# Patient Record
Sex: Male | Born: 1950
Health system: Southern US, Community
[De-identification: ages and names within clinical notes are randomized; demographics above are authoritative.]

## PROBLEM LIST (undated history)

## (undated) DIAGNOSIS — L309 Dermatitis, unspecified: Secondary | ICD-10-CM

## (undated) DIAGNOSIS — J45909 Unspecified asthma, uncomplicated: Secondary | ICD-10-CM

## (undated) DIAGNOSIS — I779 Disorder of arteries and arterioles, unspecified: Secondary | ICD-10-CM

## (undated) DIAGNOSIS — I739 Peripheral vascular disease, unspecified: Secondary | ICD-10-CM

## (undated) DIAGNOSIS — N529 Male erectile dysfunction, unspecified: Secondary | ICD-10-CM

## (undated) DIAGNOSIS — E785 Hyperlipidemia, unspecified: Secondary | ICD-10-CM

## (undated) DIAGNOSIS — Z889 Allergy status to unspecified drugs, medicaments and biological substances status: Secondary | ICD-10-CM

## (undated) DIAGNOSIS — I10 Essential (primary) hypertension: Secondary | ICD-10-CM

## (undated) DIAGNOSIS — H269 Unspecified cataract: Secondary | ICD-10-CM

## (undated) HISTORY — DX: Unspecified cataract: H26.9

## (undated) HISTORY — DX: Allergy status to unspecified drugs, medicaments and biological substances: Z88.9

## (undated) HISTORY — PX: COLONOSCOPY: SHX174

## (undated) HISTORY — PX: VASECTOMY: SHX75

## (undated) HISTORY — DX: Male erectile dysfunction, unspecified: N52.9

## (undated) HISTORY — DX: Hyperlipidemia, unspecified: E78.5

## (undated) HISTORY — DX: Peripheral vascular disease, unspecified: I73.9

## (undated) HISTORY — DX: Disorder of arteries and arterioles, unspecified: I77.9

## (undated) HISTORY — DX: Essential (primary) hypertension: I10

## (undated) HISTORY — DX: Dermatitis, unspecified: L30.9

## (undated) HISTORY — DX: Unspecified asthma, uncomplicated: J45.909

---

## 2004-05-11 ENCOUNTER — Ambulatory Visit: Payer: Self-pay | Admitting: Family Medicine

## 2005-01-01 ENCOUNTER — Ambulatory Visit: Payer: Self-pay | Admitting: Family Medicine

## 2005-07-09 ENCOUNTER — Ambulatory Visit: Payer: Self-pay | Admitting: Family Medicine

## 2005-07-11 ENCOUNTER — Ambulatory Visit: Payer: Self-pay | Admitting: Family Medicine

## 2005-07-16 ENCOUNTER — Ambulatory Visit: Payer: Self-pay | Admitting: Family Medicine

## 2006-04-22 ENCOUNTER — Ambulatory Visit: Payer: Self-pay | Admitting: Family Medicine

## 2006-06-23 ENCOUNTER — Ambulatory Visit: Payer: Self-pay | Admitting: Family Medicine

## 2006-09-18 ENCOUNTER — Ambulatory Visit: Payer: Self-pay | Admitting: Family Medicine

## 2011-10-16 ENCOUNTER — Encounter: Payer: Self-pay | Admitting: Cardiology

## 2011-10-29 ENCOUNTER — Encounter: Payer: Self-pay | Admitting: Cardiology

## 2011-10-29 ENCOUNTER — Encounter: Payer: Self-pay | Admitting: *Deleted

## 2011-10-29 ENCOUNTER — Ambulatory Visit (INDEPENDENT_AMBULATORY_CARE_PROVIDER_SITE_OTHER): Payer: Self-pay | Admitting: Cardiology

## 2011-10-29 VITALS — BP 126/85 | HR 79 | Ht 70.0 in | Wt 231.4 lb

## 2011-10-29 DIAGNOSIS — Z136 Encounter for screening for cardiovascular disorders: Secondary | ICD-10-CM

## 2011-10-29 DIAGNOSIS — I6529 Occlusion and stenosis of unspecified carotid artery: Secondary | ICD-10-CM

## 2011-10-29 DIAGNOSIS — E785 Hyperlipidemia, unspecified: Secondary | ICD-10-CM | POA: Insufficient documentation

## 2011-10-29 DIAGNOSIS — I10 Essential (primary) hypertension: Secondary | ICD-10-CM

## 2011-10-29 NOTE — Progress Notes (Signed)
HPI The patient presents for evaluation of known carotid plaque. He had a health screening 4 years ago and said that he had some moderate to mild plaque but he has not had followup of this. He's never had any other cardiac history. He doesn't report any stress testing or other cardiac testing. He does have cardiovascular risk factors however. 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 With mild to moderate activity he does not get chest pressure, neck or arm discomfort. He does not have palpitations, presyncope or syncope. He may get some shortness of breath with moderate activity but he doesn't describe any PND or orthopnea. He says his wife tells and he "breathes hard" at night. He's had no weight gain or edema.  No Known Allergies  Current Outpatient Prescriptions  Medication Sig Dispense Refill  . amLODipine (NORVASC) 5 MG tablet Take 5 mg by mouth daily.      . Montelukast Sodium (SINGULAIR PO) Take by mouth as needed.      . sildenafil (VIAGRA) 100 MG tablet Take 50 mg by mouth daily as needed.       . zolpidem (AMBIEN) 10 MG tablet Take 10 mg by mouth at bedtime as needed.        Past Medical History  Diagnosis Date  . Erectile dysfunction   . Multiple allergies   . Hypertension   . Hyperlipidemia     Past Surgical History  Procedure Date  . Colonoscopy     2009  . Vasectomy     Family History  Problem Relation Age of Onset  . Hypertension Father   . Stroke Father 1  . Heart disease Father     "Enlarged heart"    History   Social History  . Marital Status: Married    Spouse Name: N/A    Number of Children: 0  . Years of Education: N/A   Occupational History  . Machinist/farmer    Social History Main Topics  . Smoking status: Former Smoker -- 3.0 packs/day for 10 years    Types: Cigarettes    Quit date: 03/25/1978  . Smokeless tobacco: Not on file   Comment: Only smoked socially   . Alcohol Use: Yes     occ.  . Drug  Use: No  . Sexually Active: Not on file   Other Topics Concern  . Not on file   Social History Narrative  . No narrative on file    ROS:  Positive for eczema, sinus problems, seasonal allergies, occasional dizziness, reflux, poor sleep habits. Otherwise as stated in the HPI and negative for all other systems.   PHYSICAL EXAM BP 126/85  Pulse 79  Ht 5\' 10"  (1.778 m)  Wt 231 lb 6.4 oz (104.962 kg)  BMI 33.20 kg/m2 GENERAL:  Well appearing HEENT:  Pupils equal round and reactive, fundi not visualized, oral mucosa unremarkable NECK:  No jugular venous distention, waveform within normal limits, carotid upstroke brisk and symmetric, no bruits, no thyromegaly LYMPHATICS:  No cervical, inguinal adenopathy LUNGS:  Clear to auscultation bilaterally BACK:  No CVA tenderness CHEST:  Unremarkable HEART:  PMI not displaced or sustained,S1 and S2 within normal limits, no S3, no S4, no clicks, no rubs, no murmurs ABD:  Flat, positive bowel sounds normal in frequency in pitch, no bruits, no rebound, no guarding, no midline pulsatile mass, no hepatomegaly, no splenomegaly EXT:  2 plus pulses throughout, no edema, no cyanosis no clubbing SKIN:  No  rashes no nodules NEURO:  Cranial nerves II through XII grossly intact, motor grossly intact throughout PSYCH:  Cognitively intact, oriented to person place and time  EKG:  Sinus arrhythmia, rate 79, low voltage in limb leads, poor anterior R wave progression, no acute ST-T wave changes. 10/29/2011  ASSESSMENT AND PLAN  Carotid stenosis - I will follow with carotid Dopplers to reassess this.  Dyspnea -  He has some mild dyspnea and with this and evidence of some vascular disease as above and risk factors will have screening stress testing. I will bring the patient back for a POET (Plain Old Exercise Test). This will allow me to screen for obstructive coronary disease, risk stratify and very importantly provide a prescription for  exercise.  Overweight- The patient understands the need to lose weight with diet and exercise. We have discussed specific strategies for this.  Hypertension - His blood pressure today is well controlled and he will continue the meds as listed.   Dyslipidemia- He thinks his LDL is less than 100 which would be the target. I will defer to Josue Hector, MD

## 2011-10-29 NOTE — Patient Instructions (Addendum)
   Exercise Treadmill Test [facility] office)  Carotid Dopplers   Office will contact with results Follow up as needed

## 2011-11-01 ENCOUNTER — Encounter (INDEPENDENT_AMBULATORY_CARE_PROVIDER_SITE_OTHER): Payer: BC Managed Care – PPO

## 2011-11-01 DIAGNOSIS — I6529 Occlusion and stenosis of unspecified carotid artery: Secondary | ICD-10-CM

## 2011-11-07 ENCOUNTER — Ambulatory Visit (INDEPENDENT_AMBULATORY_CARE_PROVIDER_SITE_OTHER): Payer: BC Managed Care – PPO | Admitting: Nurse Practitioner

## 2011-11-07 ENCOUNTER — Encounter: Payer: Self-pay | Admitting: Nurse Practitioner

## 2011-11-07 DIAGNOSIS — I6529 Occlusion and stenosis of unspecified carotid artery: Secondary | ICD-10-CM

## 2011-11-07 NOTE — Procedures (Signed)
Exercise Treadmill Test  Pre-Exercise Testing Evaluation Rhythm: normal sinus  Rate: 74   PR:  .16 QRS:  .08  QT:  .36 QTc: .40     Test  Exercise Tolerance Test Ordering MD: Angelina Sheriff, MD  Interpreting MD: Ward Givens , NP  Unique Test No: 1  Treadmill:  1  Indication for ETT: HTN  Contraindication to ETT: No   Stress Modality: exercise - treadmill  Cardiac Imaging Performed: non   Protocol: standard Bruce - maximal  Max BP:  220/63  Max MPHR (bpm):  160 85% MPR (bpm):  136  MPHR obtained (bpm):  171 % MPHR obtained:  105%  Reached 85% MPHR (min:sec):  3:30 Total Exercise Time (min-sec): 9:00  Workload in METS:  10.1 Borg Scale: 14  Reason ETT Terminated:  fatigue    ST Segment Analysis At Rest: normal ST segments - no evidence of significant ST depression With Exercise: no evidence of significant ST depression  Other Information Arrhythmia:  isolated pvc's - asymptomatic Angina during ETT:  absent (0) Quality of ETT:  diagnostic  ETT Interpretation:  normal - no evidence of ischemia by ST analysis  Comments: Good exercise tolerance.  No chest pain.  No acute st/t changes.  Blood pressure rose to peak of 220/63 during exercise and returned to 119/73 by 6 mins in recovery. HR reduced from 171 to 157 within 1 minute of end of exercise.  Recommendations: Follow-up with Dr. Antoine Poche in 1 yr as planned.

## 2012-11-20 ENCOUNTER — Encounter (INDEPENDENT_AMBULATORY_CARE_PROVIDER_SITE_OTHER): Payer: BC Managed Care – PPO

## 2012-11-20 DIAGNOSIS — I6529 Occlusion and stenosis of unspecified carotid artery: Secondary | ICD-10-CM

## 2013-12-08 ENCOUNTER — Other Ambulatory Visit (HOSPITAL_COMMUNITY): Payer: Self-pay | Admitting: Radiology

## 2013-12-08 DIAGNOSIS — I6529 Occlusion and stenosis of unspecified carotid artery: Secondary | ICD-10-CM

## 2013-12-16 ENCOUNTER — Encounter (INDEPENDENT_AMBULATORY_CARE_PROVIDER_SITE_OTHER): Payer: BC Managed Care – PPO

## 2013-12-16 DIAGNOSIS — I6529 Occlusion and stenosis of unspecified carotid artery: Secondary | ICD-10-CM

## 2014-02-07 ENCOUNTER — Telehealth: Payer: Self-pay | Admitting: Family Medicine

## 2014-02-07 NOTE — Telephone Encounter (Signed)
Patient currently has bcbs but he is retiring in January- He currently is taking amlodipine, atorvastatin, viagra, singulair. Appointment given for January with Jannifer Rodneyhristy Hawks, FNP

## 2014-04-18 ENCOUNTER — Ambulatory Visit: Payer: Self-pay | Admitting: Family

## 2014-04-20 ENCOUNTER — Ambulatory Visit (INDEPENDENT_AMBULATORY_CARE_PROVIDER_SITE_OTHER): Payer: BLUE CROSS/BLUE SHIELD | Admitting: Family

## 2014-04-20 ENCOUNTER — Encounter: Payer: Self-pay | Admitting: Family

## 2014-04-20 ENCOUNTER — Encounter (INDEPENDENT_AMBULATORY_CARE_PROVIDER_SITE_OTHER): Payer: Self-pay

## 2014-04-20 VITALS — BP 147/93 | HR 79 | Temp 97.1°F | Ht 70.0 in | Wt 231.8 lb

## 2014-04-20 DIAGNOSIS — E785 Hyperlipidemia, unspecified: Secondary | ICD-10-CM

## 2014-04-20 DIAGNOSIS — Z1321 Encounter for screening for nutritional disorder: Secondary | ICD-10-CM

## 2014-04-20 DIAGNOSIS — I10 Essential (primary) hypertension: Secondary | ICD-10-CM

## 2014-04-20 DIAGNOSIS — N529 Male erectile dysfunction, unspecified: Secondary | ICD-10-CM

## 2014-04-20 DIAGNOSIS — Z125 Encounter for screening for malignant neoplasm of prostate: Secondary | ICD-10-CM

## 2014-04-20 MED ORDER — SILDENAFIL CITRATE 100 MG PO TABS: 50.0000 mg | ORAL_TABLET | Freq: Every day | ORAL | Status: DC | PRN

## 2014-04-20 MED ORDER — AMLODIPINE BESYLATE 10 MG PO TABS: 10.0000 mg | ORAL_TABLET | Freq: Every day | ORAL | Status: DC

## 2014-04-20 MED ORDER — ATORVASTATIN CALCIUM 10 MG PO TABS: 10.0000 mg | ORAL_TABLET | Freq: Every day | ORAL | Status: DC

## 2014-04-20 NOTE — Patient Instructions (Signed)

## 2014-04-20 NOTE — Progress Notes (Signed)
Subjective:    Patient ID: John Mejia, male    DOB: [birth year], 64 y.o.   MRN: [id number]  Hypertension This is a chronic problem. The current episode started more than 1 year ago. The problem has been waxing and waning since onset. The problem is uncontrolled. Pertinent negatives include no anxiety, blurred vision, headaches, palpitations, peripheral edema or shortness of breath. Risk factors for coronary artery disease include dyslipidemia, male gender and family history. Past treatments include calcium channel blockers. The current treatment provides moderate improvement. Hypertensive end-organ damage includes CAD/MI. There is no history of kidney disease, CVA, heart failure or a thyroid problem. There is no history of sleep apnea.  Hyperlipidemia This is a chronic problem. The current episode started more than 1 year ago. The problem is controlled. Recent lipid tests were reviewed and are normal. He has no history of diabetes or hypothyroidism. Pertinent negatives include no leg pain, myalgias or shortness of breath. Current antihyperlipidemic treatment includes statins. The current treatment provides moderate improvement of lipids. Risk factors for coronary artery disease include dyslipidemia, family history, hypertension and male sex.      Review of Systems  Constitutional: Negative.   HENT: Negative.   Eyes: Negative for blurred vision.  Respiratory: Negative.  Negative for shortness of breath.   Cardiovascular: Negative.  Negative for palpitations.  Gastrointestinal: Negative.   Endocrine: Negative.   Genitourinary: Negative.   Musculoskeletal: Negative.  Negative for myalgias.  Skin: Negative.   Neurological: Negative.  Negative for headaches.  Hematological: Negative.   Psychiatric/Behavioral: Negative.   All other systems reviewed and are negative.      Objective:   Physical Exam  Constitutional: He is oriented to person, place, and time. He appears well-developed and  well-nourished. No distress.  HENT:  Head: Normocephalic.  Right Ear: External ear normal.  Left Ear: External ear normal.  Nose: Nose normal.  Mouth/Throat: Oropharynx is clear and moist.  Eyes: Pupils are equal, round, and reactive to light. Right eye exhibits no discharge. Left eye exhibits no discharge.  Neck: Normal range of motion. Neck supple. No thyromegaly present.  Cardiovascular: Normal rate, regular rhythm, normal heart sounds and intact distal pulses.   No murmur heard. Pulmonary/Chest: Effort normal and breath sounds normal. No respiratory distress. He has no wheezes.  Abdominal: Soft. Bowel sounds are normal. He exhibits no distension. There is no tenderness.  Musculoskeletal: Normal range of motion. He exhibits no edema or tenderness.  Neurological: He is alert and oriented to person, place, and time. He has normal reflexes. No cranial nerve deficit.  Skin: Skin is warm and dry. No rash noted. No erythema.  Psychiatric: He has a normal mood and affect. His behavior is normal. Judgment and thought content normal.  Vitals reviewed.   BP 158/97 mmHg  Pulse 81  Temp(Src) 97.1 F (36.2 C) (Oral)  Ht '5\' 10"'  (1.778 m)  Wt 231 lb 12.8 oz (105.144 kg)  BMI 33.26 kg/m2       Assessment & Plan:  1. Essential hypertension --Amlodipine increased from 89m to 144mdaily Daily blood pressure log given with instructions on how to fill out and told to bring to next visit -Dash diet information given -Exercise encouraged - Stress Management  -Continue current meds -RTO in 2 weeks  CMP14+EGFR - amLODipine (NORVASC) 10 MG tablet; Take 1 tablet (10 mg total) by mouth daily.  Dispense: 90 tablet; Refill: 3  2. Hyperlipidemia - CMP14+EGFR - Lipid panel - atorvastatin (LIPITOR) 10 MG  tablet; Take 1 tablet (10 mg total) by mouth daily.  Dispense: 90 tablet; Refill: 3  3. Prostate cancer screening - PSA, total and free  4. Encounter for vitamin deficiency screening - Vit D   25 hydroxy (rtn osteoporosis monitoring  5. Erectile dysfunction, unspecified erectile dysfunction type - sildenafil (VIAGRA) 100 MG tablet; Take 0.5 tablets (50 mg total) by mouth daily as needed.  Dispense: 10 tablet; Refill: 2  Continue all meds Labs pending Health Maintenance reviewed Diet and exercise encouraged RTO 2 weeks   Evelina Dun, FNP

## 2014-04-21 LAB — CMP14+EGFR
ALT: 31 [IU]/L (ref 0–44)
AST: 36 [IU]/L (ref 0–40)
Albumin/Globulin Ratio: 2 (ref 1.1–2.5)
Albumin: 4.7 g/dL (ref 3.6–4.8)
Alkaline Phosphatase: 80 [IU]/L (ref 39–117)
BUN/Creatinine Ratio: 13 (ref 10–22)
BUN: 13 mg/dL (ref 8–27)
CO2: 25 mmol/L (ref 18–29)
Calcium: 11.2 mg/dL — ABNORMAL HIGH (ref 8.6–10.2)
Chloride: 104 mmol/L (ref 97–108)
Creatinine, Ser: 1.02 mg/dL (ref 0.76–1.27)
GFR calc Af Amer: 90 mL/min/{1.73_m2}
GFR calc non Af Amer: 78 mL/min/{1.73_m2}
Globulin, Total: 2.4 g/dL (ref 1.5–4.5)
Glucose: 100 mg/dL — ABNORMAL HIGH (ref 65–99)
Potassium: 4.7 mmol/L (ref 3.5–5.2)
Sodium: 142 mmol/L (ref 134–144)
Total Bilirubin: 0.6 mg/dL (ref 0.0–1.2)
Total Protein: 7.1 g/dL (ref 6.0–8.5)

## 2014-04-21 LAB — LIPID PANEL
Chol/HDL Ratio: 2.3 ratio (ref 0.0–5.0)
Cholesterol, Total: 135 mg/dL (ref 100–199)
HDL: 59 mg/dL
LDL Calculated: 64 mg/dL (ref 0–99)
Triglycerides: 60 mg/dL (ref 0–149)
VLDL Cholesterol Cal: 12 mg/dL (ref 5–40)

## 2014-04-21 LAB — PSA, TOTAL AND FREE
PSA, Free Pct: 28.6 %
PSA, Free: 0.2 ng/mL
PSA: 0.7 ng/mL (ref 0.0–4.0)

## 2014-04-21 LAB — VITAMIN D 25 HYDROXY (VIT D DEFICIENCY, FRACTURES): VIT D 25 HYDROXY: 18 ng/mL — AB (ref 30.0–100.0)

## 2014-04-22 ENCOUNTER — Other Ambulatory Visit: Payer: Self-pay | Admitting: Family

## 2014-04-22 MED ORDER — VITAMIN D (ERGOCALCIFEROL) 1.25 MG (50000 UNIT) PO CAPS: 50000.0000 [IU] | ORAL_CAPSULE | ORAL | Status: DC

## 2014-05-02 ENCOUNTER — Telehealth: Payer: Self-pay | Admitting: *Deleted

## 2014-05-02 NOTE — Telephone Encounter (Signed)
John Mejia, Ins co denied his viagra saying he must fail formulary medication which is cialis.  Will this work for him and if so will you take care of? Thanks

## 2014-05-06 ENCOUNTER — Encounter: Payer: Self-pay | Admitting: Family

## 2014-05-06 ENCOUNTER — Ambulatory Visit (INDEPENDENT_AMBULATORY_CARE_PROVIDER_SITE_OTHER): Payer: BLUE CROSS/BLUE SHIELD | Admitting: Family

## 2014-05-06 VITALS — BP 136/85 | HR 65 | Temp 97.5°F | Ht 70.0 in | Wt 229.4 lb

## 2014-05-06 DIAGNOSIS — N529 Male erectile dysfunction, unspecified: Secondary | ICD-10-CM

## 2014-05-06 DIAGNOSIS — I10 Essential (primary) hypertension: Secondary | ICD-10-CM

## 2014-05-06 MED ORDER — TADALAFIL 20 MG PO TABS: 20.0000 mg | ORAL_TABLET | Freq: Every day | ORAL | Status: DC | PRN

## 2014-05-06 MED ORDER — SILDENAFIL CITRATE 20 MG PO TABS: ORAL_TABLET | ORAL | Status: DC

## 2014-05-06 NOTE — Patient Instructions (Signed)

## 2014-05-06 NOTE — Progress Notes (Signed)
   Subjective:    Patient ID: John Mejia, male    DOB: 06/01/1950, 64 y.o.   MRN: [id number]  Pt presents to the office for follow-up on hypertension. Pt's BP today is at goal!! Hypertension This is a chronic problem. The current episode started more than 1 year ago. The problem has been resolved since onset. The problem is controlled. Pertinent negatives include no anxiety, headaches, malaise/fatigue, palpitations, peripheral edema or shortness of breath. Risk factors for coronary artery disease include dyslipidemia and male gender. Past treatments include calcium channel blockers. The current treatment provides moderate improvement. There is no history of kidney disease, CAD/MI, CVA, heart failure or a thyroid problem. There is no history of sleep apnea.      Review of Systems  Constitutional: Negative.  Negative for malaise/fatigue.  HENT: Negative.   Respiratory: Negative.  Negative for shortness of breath.   Cardiovascular: Negative.  Negative for palpitations.  Gastrointestinal: Negative.   Endocrine: Negative.   Genitourinary: Negative.   Musculoskeletal: Negative.   Neurological: Negative.  Negative for headaches.  Hematological: Negative.   Psychiatric/Behavioral: Negative.   All other systems reviewed and are negative.      Objective:   Physical Exam  Constitutional: He is oriented to person, place, and time. He appears well-developed and well-nourished. No distress.  HENT:  Head: Normocephalic.  Right Ear: External ear normal.  Left Ear: External ear normal.  Nose: Nose normal.  Mouth/Throat: Oropharynx is clear and moist.  Eyes: Pupils are equal, round, and reactive to light. Right eye exhibits no discharge. Left eye exhibits no discharge.  Neck: Normal range of motion. Neck supple. No thyromegaly present.  Cardiovascular: Normal rate, regular rhythm, normal heart sounds and intact distal pulses.   No murmur heard. Pulmonary/Chest: Effort normal and breath sounds  normal. No respiratory distress. He has no wheezes.  Abdominal: Soft. Bowel sounds are normal. He exhibits no distension. There is no tenderness.  Musculoskeletal: Normal range of motion. He exhibits no edema or tenderness.  Neurological: He is alert and oriented to person, place, and time. He has normal reflexes. No cranial nerve deficit.  Skin: Skin is warm and dry. No rash noted. No erythema.  Psychiatric: He has a normal mood and affect. His behavior is normal. Judgment and thought content normal.  Vitals reviewed.    BP 136/85 mmHg  Pulse 65  Temp(Src) 97.5 F (36.4 C) (Oral)  Ht 5\' 10"  (1.778 m)  Wt 229 lb 6.4 oz (104.055 kg)  BMI 32.92 kg/m2      Assessment & Plan:  1. Essential hypertension -Daily blood pressure log given with instructions on how to fill out and told to bring to next visit -Dash diet information given -Exercise encouraged - Stress Management  -Continue current meds -RTO in 6 months  2. Erectile dysfunction, unspecified erectile dysfunction type -Pt was given printed [facility] for [facility] price if cialis rx is too expensive - sildenafil (REVATIO) 20 MG tablet; Take 2-5 tables as needed for sexual activity  Dispense: 50 tablet; Refill: 3 - tadalafil (CIALIS) 20 MG tablet; Take 1 tablet (20 mg total) by mouth daily as needed for erectile dysfunction.  Dispense: 10 tablet; Refill: 0   Jannifer Rodneyhristy Tijuan Dantes, FNP

## 2014-05-17 NOTE — Telephone Encounter (Signed)
Medication changed by Christy.  

## 2014-11-07 ENCOUNTER — Ambulatory Visit (INDEPENDENT_AMBULATORY_CARE_PROVIDER_SITE_OTHER): Payer: BLUE CROSS/BLUE SHIELD | Admitting: Family

## 2014-11-07 ENCOUNTER — Encounter: Payer: Self-pay | Admitting: Family

## 2014-11-07 VITALS — BP 138/85 | HR 78 | Temp 97.4°F | Ht 69.0 in | Wt 224.0 lb

## 2014-11-07 DIAGNOSIS — N529 Male erectile dysfunction, unspecified: Secondary | ICD-10-CM | POA: Diagnosis not present

## 2014-11-07 DIAGNOSIS — I10 Essential (primary) hypertension: Secondary | ICD-10-CM

## 2014-11-07 DIAGNOSIS — Z0000 Encounter for general adult medical examination without abnormal findings: Secondary | ICD-10-CM | POA: Diagnosis not present

## 2014-11-07 DIAGNOSIS — Z23 Encounter for immunization: Secondary | ICD-10-CM

## 2014-11-07 DIAGNOSIS — E785 Hyperlipidemia, unspecified: Secondary | ICD-10-CM | POA: Diagnosis not present

## 2014-11-07 MED ORDER — AMLODIPINE BESYLATE 10 MG PO TABS: 10.0000 mg | ORAL_TABLET | Freq: Every day | ORAL | Status: DC

## 2014-11-07 MED ORDER — ATORVASTATIN CALCIUM 10 MG PO TABS: 10.0000 mg | ORAL_TABLET | Freq: Every day | ORAL | Status: DC

## 2014-11-07 MED ORDER — SILDENAFIL CITRATE 100 MG PO TABS: 50.0000 mg | ORAL_TABLET | Freq: Every day | ORAL | Status: DC | PRN

## 2014-11-07 NOTE — Patient Instructions (Signed)

## 2014-11-07 NOTE — Addendum Note (Signed)
Addended by: Prescott Gum on: 11/07/2014 03:30 PM   Modules accepted: Kipp Brood

## 2014-11-07 NOTE — Progress Notes (Signed)
Subjective:    Patient ID: John Mejia, male    DOB: [birth year], 64 y.o.   MRN: [id number]  Pt presents to the office today for CPE.  Hypertension This is a chronic problem. The current episode started more than 1 year ago. The problem has been waxing and waning since onset. The problem is uncontrolled. Pertinent negatives include no anxiety, blurred vision, headaches, palpitations, peripheral edema or shortness of breath. Risk factors for coronary artery disease include dyslipidemia, male gender and family history. Past treatments include calcium channel blockers. The current treatment provides moderate improvement. Hypertensive end-organ damage includes CAD/MI. There is no history of kidney disease, CVA, heart failure or a thyroid problem. There is no history of sleep apnea.  Hyperlipidemia This is a chronic problem. The current episode started more than 1 year ago. The problem is controlled. Recent lipid tests were reviewed and are normal. He has no history of diabetes or hypothyroidism. Pertinent negatives include no leg pain, myalgias or shortness of breath. Current antihyperlipidemic treatment includes statins. The current treatment provides moderate improvement of lipids. Risk factors for coronary artery disease include dyslipidemia, family history, hypertension and male sex.  ED Pt states his insurance will not pay for Cialis. Pt would like to try Viagra rx today.     Review of Systems  Constitutional: Negative.   HENT: Negative.   Eyes: Negative for blurred vision.  Respiratory: Negative.  Negative for shortness of breath.   Cardiovascular: Negative.  Negative for palpitations.  Gastrointestinal: Negative.   Endocrine: Negative.   Genitourinary: Negative.   Musculoskeletal: Negative.  Negative for myalgias.  Neurological: Negative.  Negative for headaches.  Hematological: Negative.   Psychiatric/Behavioral: Negative.   All other systems reviewed and are negative.        Objective:   Physical Exam  Constitutional: He is oriented to person, place, and time. He appears well-developed and well-nourished. No distress.  HENT:  Head: Normocephalic.  Right Ear: External ear normal.  Left Ear: External ear normal.  Nose: Nose normal.  Mouth/Throat: Oropharynx is clear and moist.  Eyes: Pupils are equal, round, and reactive to light. Right eye exhibits no discharge. Left eye exhibits no discharge.  Neck: Normal range of motion. Neck supple. No thyromegaly present.  Cardiovascular: Normal rate, regular rhythm, normal heart sounds and intact distal pulses.   No murmur heard. Pulmonary/Chest: Effort normal and breath sounds normal. No respiratory distress. He has no wheezes.  Abdominal: Soft. Bowel sounds are normal. He exhibits no distension. There is no tenderness.  Musculoskeletal: Normal range of motion. He exhibits no edema or tenderness.  Neurological: He is alert and oriented to person, place, and time. He has normal reflexes. No cranial nerve deficit.  Skin: Skin is warm and dry. No rash noted. No erythema.  Psychiatric: He has a normal mood and affect. His behavior is normal. Judgment and thought content normal.  Vitals reviewed.  EKG-Sinus Rhythm    BP 151/92 mmHg  Pulse 78  Temp(Src) 97.4 F (36.3 C) (Oral)  Ht _0  (1.753 m)  Wt 224 lb (101.606 kg)  BMI 33.06 kg/m2     Assessment & Plan:  1. Essential hypertension - CMP14+EGFR - amLODipine (NORVASC) 10 MG tablet; Take 1 tablet (10 mg total) by mouth daily.  Dispense: 90 tablet; Refill: 3 - EKG 12-Lead  2. Hyperlipidemia - CMP14+EGFR - Lipid panel - atorvastatin (LIPITOR) 10 MG tablet; Take 1 tablet (10 mg total) by mouth daily.  Dispense: 90 tablet; Refill: 3  3. Erectile dysfunction, unspecified erectile dysfunction type - CMP14+EGFR  4. Annual physical exam - CBC - CMP14+EGFR - Lipid panel - Thyroid Panel With TSH - Vit D  25 hydroxy (rtn osteoporosis monitoring) - Hepatitis  C antibody - EKG 12-Lead   Continue all meds Labs pending Health Maintenance reviewed-TDAP given today Diet and exercise encouraged RTO 6 months  Evelina Dun, FNP

## 2014-11-07 NOTE — Addendum Note (Signed)
Addended by: Jannifer Rodney A on: 11/07/2014 03:29 PM   Modules accepted: Orders

## 2014-11-08 ENCOUNTER — Other Ambulatory Visit: Payer: Self-pay | Admitting: Family

## 2014-11-08 LAB — CBC
Hematocrit: 40.4 % (ref 37.5–51.0)
Hemoglobin: 14 g/dL (ref 12.6–17.7)
MCH: 26.9 pg (ref 26.6–33.0)
MCHC: 34.7 g/dL (ref 31.5–35.7)
MCV: 78 fL — AB (ref 79–97)
PLATELETS: 196 10*3/uL (ref 150–379)
RBC: 5.21 x10E6/uL (ref 4.14–5.80)
RDW: 14.6 % (ref 12.3–15.4)
WBC: 4.8 10*3/uL (ref 3.4–10.8)

## 2014-11-08 LAB — CMP14+EGFR
ALT: 21 IU/L (ref 0–44)
AST: 21 IU/L (ref 0–40)
Albumin/Globulin Ratio: 2 (ref 1.1–2.5)
Albumin: 4.5 g/dL (ref 3.6–4.8)
Alkaline Phosphatase: 70 IU/L (ref 39–117)
BILIRUBIN TOTAL: 0.5 mg/dL (ref 0.0–1.2)
BUN/Creatinine Ratio: 10 (ref 10–22)
BUN: 9 mg/dL (ref 8–27)
CALCIUM: 10.7 mg/dL — AB (ref 8.6–10.2)
CO2: 26 mmol/L (ref 18–29)
Chloride: 103 mmol/L (ref 97–108)
Creatinine, Ser: 0.9 mg/dL (ref 0.76–1.27)
GFR calc Af Amer: 105 mL/min/{1.73_m2} (ref >59)
GFR, EST NON AFRICAN AMERICAN: 91 mL/min/{1.73_m2} (ref >59)
Globulin, Total: 2.2 g/dL (ref 1.5–4.5)
Glucose: 86 mg/dL (ref 65–99)
POTASSIUM: 4 mmol/L (ref 3.5–5.2)
Sodium: 144 mmol/L (ref 134–144)
Total Protein: 6.7 g/dL (ref 6.0–8.5)

## 2014-11-08 LAB — LIPID PANEL
Chol/HDL Ratio: 2.2 ratio units (ref 0.0–5.0)
Cholesterol, Total: 112 mg/dL (ref 100–199)
HDL: 52 mg/dL (ref >39)
LDL Calculated: 51 mg/dL (ref 0–99)
TRIGLYCERIDES: 47 mg/dL (ref 0–149)
VLDL Cholesterol Cal: 9 mg/dL (ref 5–40)

## 2014-11-08 LAB — HEPATITIS C ANTIBODY: Hep C Virus Ab: <0.1 s/co ratio (ref 0.0–0.9)

## 2014-11-08 LAB — THYROID PANEL WITH TSH
FREE THYROXINE INDEX: 1.4 (ref 1.2–4.9)
T3 Uptake Ratio: 25 % (ref 24–39)
T4, Total: 5.7 ug/dL (ref 4.5–12.0)
TSH: 0.852 u[IU]/mL (ref 0.450–4.500)

## 2014-11-08 LAB — VITAMIN D 25 HYDROXY (VIT D DEFICIENCY, FRACTURES): VIT D 25 HYDROXY: 24.5 ng/mL — AB (ref 30.0–100.0)

## 2014-11-08 MED ORDER — VITAMIN D (ERGOCALCIFEROL) 1.25 MG (50000 UNIT) PO CAPS: 50000.0000 [IU] | ORAL_CAPSULE | ORAL | Status: DC

## 2014-11-09 ENCOUNTER — Telehealth: Payer: Self-pay

## 2014-11-09 NOTE — Telephone Encounter (Signed)
Insurance prior authorized Viagra for one year

## 2015-03-01 ENCOUNTER — Ambulatory Visit (INDEPENDENT_AMBULATORY_CARE_PROVIDER_SITE_OTHER): Payer: BLUE CROSS/BLUE SHIELD

## 2015-03-01 DIAGNOSIS — Z23 Encounter for immunization: Secondary | ICD-10-CM | POA: Diagnosis not present

## 2015-05-11 ENCOUNTER — Ambulatory Visit: Payer: BLUE CROSS/BLUE SHIELD | Admitting: Family

## 2015-10-11 ENCOUNTER — Other Ambulatory Visit: Payer: Self-pay | Admitting: Family

## 2015-10-12 NOTE — Telephone Encounter (Signed)
Last seen and last Vit D 11/07/14  John Mejia   24.5

## 2015-10-25 ENCOUNTER — Encounter: Payer: Self-pay | Admitting: Family

## 2015-10-25 ENCOUNTER — Ambulatory Visit (INDEPENDENT_AMBULATORY_CARE_PROVIDER_SITE_OTHER): Payer: BLUE CROSS/BLUE SHIELD

## 2015-10-25 ENCOUNTER — Ambulatory Visit (INDEPENDENT_AMBULATORY_CARE_PROVIDER_SITE_OTHER): Payer: BLUE CROSS/BLUE SHIELD | Admitting: Family

## 2015-10-25 VITALS — BP 138/83 | HR 80 | Temp 97.9°F | Ht 69.0 in | Wt 228.0 lb

## 2015-10-25 DIAGNOSIS — Z87891 Personal history of nicotine dependence: Secondary | ICD-10-CM | POA: Diagnosis not present

## 2015-10-25 DIAGNOSIS — Z114 Encounter for screening for human immunodeficiency virus [HIV]: Secondary | ICD-10-CM

## 2015-10-25 DIAGNOSIS — Z0000 Encounter for general adult medical examination without abnormal findings: Secondary | ICD-10-CM | POA: Diagnosis not present

## 2015-10-25 DIAGNOSIS — N529 Male erectile dysfunction, unspecified: Secondary | ICD-10-CM

## 2015-10-25 DIAGNOSIS — E785 Hyperlipidemia, unspecified: Secondary | ICD-10-CM

## 2015-10-25 DIAGNOSIS — Z125 Encounter for screening for malignant neoplasm of prostate: Secondary | ICD-10-CM

## 2015-10-25 DIAGNOSIS — I10 Essential (primary) hypertension: Secondary | ICD-10-CM

## 2015-10-25 LAB — MICROSCOPIC EXAMINATION
BACTERIA UA: NONE SEEN
EPITHELIAL CELLS (NON RENAL): NONE SEEN /HPF (ref 0–10)
RBC MICROSCOPIC, UA: NONE SEEN /HPF (ref 0–?)
WBC, UA: NONE SEEN /hpf (ref 0–?)

## 2015-10-25 LAB — URINALYSIS, COMPLETE
BILIRUBIN UA: NEGATIVE
GLUCOSE, UA: NEGATIVE
KETONES UA: NEGATIVE
LEUKOCYTES UA: NEGATIVE
NITRITE UA: NEGATIVE
PH UA: 7 (ref 5.0–7.5)
Protein, UA: NEGATIVE
RBC, UA: NEGATIVE
SPEC GRAV UA: 1.02 (ref 1.005–1.030)
Urobilinogen, Ur: 0.2 mg/dL (ref 0.2–1.0)

## 2015-10-25 NOTE — Progress Notes (Addendum)
Subjective:    Patient ID: John Mejia, male    DOB: Aug 11, 1950, 65 y.o.   MRN: [id number]  Pt presents to the office today for CPE.  Hypertension  This is a chronic problem. The current episode started more than 1 year ago. The problem has been resolved since onset. The problem is controlled. Associated symptoms include headaches ("at times"). Pertinent negatives include no anxiety, blurred vision, palpitations, peripheral edema or shortness of breath. Risk factors for coronary artery disease include dyslipidemia, male gender and family history. Past treatments include calcium channel blockers. The current treatment provides moderate improvement. Hypertensive end-organ damage includes CAD/MI. There is no history of kidney disease, CVA, heart failure or a thyroid problem. There is no history of sleep apnea.  Hyperlipidemia  This is a chronic problem. The current episode started more than 1 year ago. The problem is controlled. Recent lipid tests were reviewed and are normal. He has no history of diabetes or hypothyroidism. Pertinent negatives include no leg pain, myalgias or shortness of breath. Current antihyperlipidemic treatment includes statins. The current treatment provides moderate improvement of lipids. Risk factors for coronary artery disease include dyslipidemia, family history, hypertension and male sex.  ED Pt currently taking  Viagra rx today. Pt states he is having trouble having ejaculation.    Review of Systems  Constitutional: Negative.   Eyes: Negative for blurred vision.  Respiratory: Negative.  Negative for shortness of breath.   Cardiovascular: Negative.  Negative for palpitations.  Gastrointestinal: Negative.   Endocrine: Negative.   Genitourinary: Negative.   Musculoskeletal: Negative.  Negative for myalgias.  Neurological: Positive for headaches ("at times").  Hematological: Negative.   Psychiatric/Behavioral: Negative.   All other systems reviewed and are  negative.      Objective:   Physical Exam  Constitutional: He is oriented to person, place, and time. He appears well-developed and well-nourished. No distress.  HENT:  Head: Normocephalic.  Right Ear: External ear normal.  Left Ear: External ear normal.  Nose: Nose normal.  Mouth/Throat: Oropharynx is clear and moist.  Eyes: Pupils are equal, round, and reactive to light. Right eye exhibits no discharge. Left eye exhibits no discharge.  Neck: Normal range of motion. Neck supple. No thyromegaly present.  Cardiovascular: Normal rate, regular rhythm, normal heart sounds and intact distal pulses.   No murmur heard. Pulmonary/Chest: Effort normal and breath sounds normal. No respiratory distress. He has no wheezes.  Abdominal: Soft. Bowel sounds are normal. He exhibits no distension. There is no tenderness.  Musculoskeletal: Normal range of motion. He exhibits no edema or tenderness.  Neurological: He is alert and oriented to person, place, and time. He has normal reflexes. No cranial nerve deficit.  Skin: Skin is warm and dry. No rash noted. No erythema.  Psychiatric: He has a normal mood and affect. His behavior is normal. Judgment and thought content normal.  Vitals reviewed.    BP 138/83   Pulse 80   Temp 97.9 F (36.6 C) (Oral)   Ht '5\' 9"'  (1.753 m)   Wt 228 lb (103.4 kg)   BMI 33.67 kg/m      Assessment & Plan:  1. Annual physical exam - Urinalysis, Complete - CMP14+EGFR - Lipid panel - Anemia Profile B - Thyroid Panel With TSH - PSA, total and free - VITAMIN D 25 Hydroxy (Vit-D Deficiency, Fractures) - HIV antibody - DG Chest 2 View; Future  2. Essential hypertension - CMP14+EGFR  3. Erectile dysfunction, unspecified erectile dysfunction type - CMP14+EGFR  4. Hyperlipidemia - CMP14+EGFR - Lipid panel  5. Encounter for screening for HIV - CMP14+EGFR - HIV antibody  6. Prostate cancer screening - CMP14+EGFR - PSA, total and free  7. Former smoker,  stopped smoking in distant past - DG Chest 2 View; Future   Continue all meds Labs pending Health Maintenance reviewed Diet and exercise encouraged RTO 6 months  Evelina Dun, FNP

## 2015-10-25 NOTE — Patient Instructions (Signed)

## 2015-10-26 LAB — CMP14+EGFR
ALK PHOS: 81 IU/L (ref 39–117)
ALT: 15 IU/L (ref 0–44)
AST: 24 IU/L (ref 0–40)
Albumin/Globulin Ratio: 1.7 (ref 1.2–2.2)
Albumin: 4.6 g/dL (ref 3.6–4.8)
BILIRUBIN TOTAL: 0.7 mg/dL (ref 0.0–1.2)
BUN/Creatinine Ratio: 11 (ref 10–24)
BUN: 10 mg/dL (ref 8–27)
CHLORIDE: 105 mmol/L (ref 96–106)
CO2: 20 mmol/L (ref 18–29)
Calcium: 11.2 mg/dL — ABNORMAL HIGH (ref 8.6–10.2)
Creatinine, Ser: 0.87 mg/dL (ref 0.76–1.27)
GFR calc non Af Amer: 91 mL/min/{1.73_m2} (ref >59)
GFR, EST AFRICAN AMERICAN: 105 mL/min/{1.73_m2} (ref >59)
GLUCOSE: 99 mg/dL (ref 65–99)
Globulin, Total: 2.7 g/dL (ref 1.5–4.5)
Potassium: 4.3 mmol/L (ref 3.5–5.2)
Sodium: 143 mmol/L (ref 134–144)
TOTAL PROTEIN: 7.3 g/dL (ref 6.0–8.5)

## 2015-10-26 LAB — ANEMIA PROFILE B
BASOS ABS: 0 10*3/uL (ref 0.0–0.2)
Basos: 1 %
EOS (ABSOLUTE): 0.1 10*3/uL (ref 0.0–0.4)
Eos: 3 %
FOLATE: 13.4 ng/mL (ref >3.0)
Ferritin: 233 ng/mL (ref 30–400)
Hematocrit: 39.6 % (ref 37.5–51.0)
Hemoglobin: 13.5 g/dL (ref 12.6–17.7)
IMMATURE GRANULOCYTES: 0 %
IRON SATURATION: 28 % (ref 15–55)
Immature Grans (Abs): 0 10*3/uL (ref 0.0–0.1)
Iron: 85 ug/dL (ref 38–169)
LYMPHS ABS: 1.3 10*3/uL (ref 0.7–3.1)
Lymphs: 32 %
MCH: 26.6 pg (ref 26.6–33.0)
MCHC: 34.1 g/dL (ref 31.5–35.7)
MCV: 78 fL — ABNORMAL LOW (ref 79–97)
MONOS ABS: 0.4 10*3/uL (ref 0.1–0.9)
Monocytes: 11 %
NEUTROS PCT: 53 %
Neutrophils Absolute: 2.1 10*3/uL (ref 1.4–7.0)
PLATELETS: 211 10*3/uL (ref 150–379)
RBC: 5.07 x10E6/uL (ref 4.14–5.80)
RDW: 13.9 % (ref 12.3–15.4)
Retic Ct Pct: 1.8 % (ref 0.6–2.6)
Total Iron Binding Capacity: 305 ug/dL (ref 250–450)
UIBC: 220 ug/dL (ref 111–343)
Vitamin B-12: 1009 pg/mL — ABNORMAL HIGH (ref 211–946)
WBC: 4 10*3/uL (ref 3.4–10.8)

## 2015-10-26 LAB — VITAMIN D 25 HYDROXY (VIT D DEFICIENCY, FRACTURES): VIT D 25 HYDROXY: 42.8 ng/mL (ref 30.0–100.0)

## 2015-10-26 LAB — THYROID PANEL WITH TSH
FREE THYROXINE INDEX: 1.7 (ref 1.2–4.9)
T3 UPTAKE RATIO: 25 % (ref 24–39)
T4, Total: 6.6 ug/dL (ref 4.5–12.0)
TSH: 1.01 u[IU]/mL (ref 0.450–4.500)

## 2015-10-26 LAB — HIV ANTIBODY (ROUTINE TESTING W REFLEX): HIV SCREEN 4TH GENERATION: NONREACTIVE

## 2015-10-26 LAB — LIPID PANEL
CHOLESTEROL TOTAL: 129 mg/dL (ref 100–199)
Chol/HDL Ratio: 2.5 ratio units (ref 0.0–5.0)
HDL: 52 mg/dL (ref >39)
LDL Calculated: 66 mg/dL (ref 0–99)
Triglycerides: 57 mg/dL (ref 0–149)
VLDL CHOLESTEROL CAL: 11 mg/dL (ref 5–40)

## 2015-10-26 LAB — PSA, TOTAL AND FREE
PSA FREE PCT: 33.3 %
PSA FREE: 0.2 ng/mL
Prostate Specific Ag, Serum: 0.6 ng/mL (ref 0.0–4.0)

## 2015-12-12 ENCOUNTER — Other Ambulatory Visit: Payer: Self-pay | Admitting: Family

## 2015-12-19 ENCOUNTER — Other Ambulatory Visit: Payer: Self-pay | Admitting: Family

## 2015-12-20 ENCOUNTER — Other Ambulatory Visit: Payer: Self-pay | Admitting: Family

## 2016-01-10 ENCOUNTER — Other Ambulatory Visit: Payer: Self-pay | Admitting: Family

## 2016-01-10 DIAGNOSIS — I10 Essential (primary) hypertension: Secondary | ICD-10-CM

## 2016-01-10 DIAGNOSIS — E785 Hyperlipidemia, unspecified: Secondary | ICD-10-CM

## 2016-02-26 ENCOUNTER — Telehealth: Payer: Self-pay | Admitting: Family

## 2016-02-26 NOTE — Telephone Encounter (Signed)
Printed and put in prior [facility] for tomorrow

## 2016-03-01 MED ORDER — SILDENAFIL CITRATE 100 MG PO TABS: 50.0000 mg | ORAL_TABLET | Freq: Every day | ORAL | 2 refills | Status: DC | PRN

## 2016-03-01 NOTE — Telephone Encounter (Signed)
Prescription sent to pharmacy.

## 2016-03-01 NOTE — Addendum Note (Signed)
Addended by: Jannifer RodneyHAWKS, Merly Hinkson A on: 03/01/2016 12:02 PM   Modules accepted: Orders

## 2016-03-01 NOTE — Telephone Encounter (Signed)
We try and rf this pt's viagra  - note says he needs prior approval - I think I just needs to be approved by DR  - please resubmit a RX to [facility] and well go from there

## 2016-04-08 ENCOUNTER — Ambulatory Visit (INDEPENDENT_AMBULATORY_CARE_PROVIDER_SITE_OTHER): Payer: Medicare Other | Admitting: Pharmacist

## 2016-04-08 ENCOUNTER — Encounter (INDEPENDENT_AMBULATORY_CARE_PROVIDER_SITE_OTHER): Payer: Self-pay

## 2016-04-08 ENCOUNTER — Encounter: Payer: Self-pay | Admitting: Pharmacist

## 2016-04-08 VITALS — BP 146/82 | HR 76 | Ht 70.0 in | Wt 234.0 lb

## 2016-04-08 DIAGNOSIS — Z0000 Encounter for general adult medical examination without abnormal findings: Secondary | ICD-10-CM

## 2016-04-08 DIAGNOSIS — Z23 Encounter for immunization: Secondary | ICD-10-CM

## 2016-04-08 DIAGNOSIS — I739 Peripheral vascular disease, unspecified: Secondary | ICD-10-CM

## 2016-04-08 DIAGNOSIS — I779 Disorder of arteries and arterioles, unspecified: Secondary | ICD-10-CM

## 2016-04-08 NOTE — Progress Notes (Signed)
Patient ID: John Mejia, male   DOB: 22-Jul-1950, 66 y.o.   MRN: [id number]     Subjective:   John Mejia is a 66 y.o. male who presents for an Initial Medicare Annual Wellness Visit.  John Mejia is married. He and his wife live in [place], [place]. 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  He also was previously in the [employer].    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  He did not seek medical attention at the time.  Shoulder pain has gotten better but he is concerned that pain returns form time to time.  Pain is located in his right shoulder.  Often radiates up to this neck and sometimes causes pain to his right temple.   He also has a history of bilateral ICA stenosis.  His last carotid artery doppler was in 2015 per Dr Joyce Copa notes from 12/30/2013.   John Mejia also Mejia that he has been having difficulty getting his insurance to cover sildenafil.  I checked with [facility] and they filled #6 tablets for $8.00 today.    Current Medications (verified) Outpatient Encounter Prescriptions as of 04/08/2016  Medication Sig  . amLODipine (NORVASC) 10 MG tablet TAKE 1 TABLET (10 MG TOTAL) BY MOUTH DAILY.  Marland Kitchen atorvastatin (LIPITOR) 10 MG tablet TAKE 1 TABLET (10 MG TOTAL) BY MOUTH DAILY.  . sildenafil (VIAGRA) 100 MG tablet Take 0.5-1 tablets (50-100 mg total) by mouth daily as needed for erectile dysfunction.  . Vitamin D, Ergocalciferol, (DRISDOL) 50000 units CAPS capsule TAKE 1 CAPSULE (50,000 UNITS TOTAL) BY MOUTH EVERY 7 (SEVEN) DAYS.  . [DISCONTINUED] VIAGRA 100 MG tablet TAKE 0.5-1 TABLETS (50-100 MG TOTAL) BY MOUTH DAILY AS NEEDED FOR ERECTILE DYSFUNCTION.  . sildenafil (REVATIO) 20 MG tablet Take 2-5 tables as needed for sexual activity (Patient not taking: Reported on 04/08/2016)   No facility-administered encounter medications on file as of 04/08/2016.     Allergies  (verified) Patient has no known allergies.   History: Past Medical History:  Diagnosis Date  . Asthma   . Cataract   . Eczema   . Erectile dysfunction   . Hyperlipidemia   . Hypertension   . Multiple allergies   . Right-sided carotid artery disease [facility])    Past Surgical History:  Procedure Laterality Date  . COLONOSCOPY     2009  . VASECTOMY     Family History  Problem Relation Age of Onset  . Hypertension Father   . Stroke Father 28  . Heart disease Father     "Enlarged heart"  . Hypertension Mother   . Cancer Sister     ovarian cancer / fibroid tumors   Social History   Occupational History  . Machinist/farmer    Social History Main Topics  . Smoking status: Former Smoker    Packs/day: 3.00    Years: 10.00    Types: Cigarettes    Quit date: 03/25/1978  . Smokeless tobacco: Never Used     Comment: Only smoked socially   . Alcohol use Yes     Comment: occ.  . Drug use: No  . Sexual activity: Yes    Do you feel safe at home?  Yes Are there smokers in your home (other than you)? No  Dietary issues and exercise activities discussed: Current Exercise Habits: The patient has  a physically strenous job, but has no regular exercise apart from work. 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   Activities of Daily Living In your present state of health, do you have any difficulty performing the following activities: 04/08/2016  Hearing? N  Vision? N  Difficulty concentrating or making decisions? N  Walking or  climbing stairs? N  Dressing or bathing? N  Doing errands, shopping? N  Preparing Food and eating ? N  Using the Toilet? N  In the past six months, have you accidently leaked urine? N  Do you have problems with loss of bowel control? N  Managing your Medications? N  Managing your Finances? N  Housekeeping or managing your Housekeeping? N  Some recent data might be hidden     Depression Screen PHQ 2/9 Scores 04/08/2016 10/25/2015 11/07/2014 04/20/2014  PHQ - 2 Score 0 0 0 0     Fall Risk Fall Risk  04/08/2016 10/25/2015 11/07/2014 04/20/2014  Falls in the past year? Yes No No No  Number falls in past yr: 1 - - -  Injury with Fall? No - - -    Cognitive Function: MMSE - Mini Mental State Exam 04/08/2016  Orientation to time 5  Orientation to Place 5  Registration 3  Attention/ Calculation 4  Recall 3  Language- name 2 objects 2  Language- repeat 1  Language- follow 3 step command 3  Language- read & follow direction 1  Write a sentence 1  Copy design 1  Total score 29    Immunizations and Health Maintenance Immunization History  Administered Date(s) Administered  . Influenza, High Dose Seasonal PF 04/08/2016  . Influenza,inj,Quad PF,36+ Mos 03/01/2015  . Pneumococcal Conjugate-13 04/08/2016  . Tdap 11/07/2014   There are no preventive care reminders to display for this patient.  Patient Care Team: Junie Spencerhristy A Hawks, FNP as PCP - General (Nurse Practitioner)  Dr Conni ElliotLaw [place]  Indicate any recent Medical Services you may have received from other than [facility] providers in the past year (date may be approximate).    Assessment:    Annual Wellness Visit  Right shoulder pain Bilateral carotid artery stenosis   Screening Tests Health Maintenance  Topic Date Due  . ZOSTAVAX  05/23/2016 (Originally 04/05/2011)  . PNA vac Low Risk Adult (2 of 2 - PPSV23) 04/08/2017  . COLONOSCOPY  04/21/2019  . TETANUS/TDAP  11/06/2024  . INFLUENZA VACCINE  Addressed  .  Hepatitis C Screening  Completed  . HIV Screening  Completed        Plan:   During the course of the visit [facility] was educated and counseled about the following appropriate screening and preventive services:   Vaccines to include Pneumoccal, Influenza,  Td, Zostavax - patient received Prevnar 13 and influnza vaccines today  Colorectal cancer screening - last colonoscopy was 2011  Cardiovascular disease screening - due to have repeat carotid doppler - referral sent  Diabetes screening - last FBG was WNL   Glaucoma screening / Eye Exam - UTD  Nutrition counseling - Discussed DASH diet for BP / heart health  Prostate cancer screening - UTD  Physical Activity - encouraged patient to add daily exercise such as walking - goal is 30 minutes daily.  Referral to physician to evaluate shoulder pain     Patient Instructions (the written plan) were given to the patient.   Henrene Pastor, PharmD   04/08/2016

## 2016-04-08 NOTE — Patient Instructions (Addendum)
John Mejia , Thank you for taking time to come for your Medicare Wellness Visit. I appreciate your ongoing commitment to your health goals. Please review the following plan we discussed and let me know if I can assist you in the future.   These are the goals we discussed: Continue to stay active - exercise goal is 150 minutes each week.   Will send order to [facility] to get ultrasound of carotid arteries (in your neck)    This is a list of the screening recommended for you and due dates:  Health Maintenance  Topic Date Due  . Shingles Vaccine  04/05/2011  . Flu Shot  10/24/2015  . Pneumonia vaccines (1 of 2 - PCV13) 04/04/2016  . Colon Cancer Screening  04/21/2019  . Tetanus Vaccine  11/06/2024  .  Hepatitis C: One time screening is recommended by Center for Disease Control  (CDC) for  adults born from [birth year] through 1964.   Completed  . HIV Screening  Completed   DASH Eating Plan DASH stands for "Dietary Approaches to Stop Hypertension." The DASH eating plan is a healthy eating plan that has been shown to reduce high blood pressure (hypertension). Additional health benefits may include reducing the risk of type 2 diabetes mellitus, heart disease, and stroke. The DASH eating plan may also help with weight loss. What do I need to know about the DASH eating plan? For the DASH eating plan, you will follow these general guidelines:  Choose foods with less than 150 milligrams of sodium per serving (as listed on the food label).  Use salt-free seasonings or herbs instead of table salt or sea salt.  Check with your health care provider or pharmacist before using salt substitutes.  Eat lower-sodium products. These are often labeled as "low-sodium" or "no salt added."  Eat fresh foods. Avoid eating a lot of canned foods.  Eat more vegetables, fruits, and low-fat dairy products.  Choose whole grains. Look for the word "whole" as the first word in the ingredient list.  Choose fish and skinless  chicken or [place] more often than red meat. Limit fish, poultry, and meat to 6 oz (170 g) each day.  Limit sweets, desserts, sugars, and sugary drinks.  Choose heart-healthy fats.  Eat more home-cooked food and less restaurant, buffet, and fast food.  Limit fried foods.  Do not fry foods. Cook foods using methods such as baking, boiling, grilling, and broiling instead.  When eating at a restaurant, ask that your food be prepared with less salt, or no salt if possible. What foods can I eat? Seek help from a dietitian for individual calorie needs. Grains  Whole grain or whole wheat bread. Brown rice. Whole grain or whole wheat pasta. Quinoa, bulgur, and whole grain cereals. Low-sodium cereals. Corn or whole wheat flour tortillas. Whole grain cornbread. Whole grain crackers. Low-sodium crackers. Vegetables  Fresh or frozen vegetables (raw, steamed, roasted, or grilled). Low-sodium or reduced-sodium tomato and vegetable juices. Low-sodium or reduced-sodium tomato sauce and paste. Low-sodium or reduced-sodium canned vegetables. Fruits  All fresh, canned (in natural juice), or frozen fruits. Meat and Other Protein Products  Ground beef (85% or leaner), grass-fed beef, or beef trimmed of fat. Skinless chicken or [place]. Ground chicken or [place]. Pork trimmed of fat. All fish and seafood. Eggs. Dried beans, peas, or lentils. Unsalted nuts and seeds. Unsalted canned beans. Dairy  Low-fat dairy products, such as skim or 1% milk, 2% or reduced-fat cheeses, low-fat ricotta or cottage cheese, or plain  low-fat yogurt. Low-sodium or reduced-sodium cheeses. Fats and Oils  Tub margarines without trans fats. Light or reduced-fat mayonnaise and salad dressings (reduced sodium). Avocado. Safflower, olive, or canola oils. Natural peanut or almond butter. Other  Unsalted popcorn and pretzels. The items listed above may not be a complete list of recommended foods or beverages. Contact your dietitian for more  options.  What foods are not recommended? Grains  White bread. White pasta. White rice. Refined cornbread. Bagels and croissants. Crackers that contain trans fat. Vegetables  Creamed or fried vegetables. Vegetables in a cheese sauce. Regular canned vegetables. Regular canned tomato sauce and paste. Regular tomato and vegetable juices. Fruits  Canned fruit in light or heavy syrup. Fruit juice. Meat and Other Protein Products  Fatty cuts of meat. Ribs, chicken wings, bacon, sausage, bologna, salami, chitterlings, fatback, hot dogs, bratwurst, and packaged luncheon meats. Salted nuts and seeds. Canned beans with salt. Dairy  Whole or 2% milk, cream, half-and-half, and cream cheese. Whole-fat or sweetened yogurt. Full-fat cheeses or blue cheese. Nondairy creamers and whipped toppings. Processed cheese, cheese spreads, or cheese curds. Condiments  Onion and garlic salt, seasoned salt, table salt, and sea salt. Canned and packaged gravies. Worcestershire sauce. Tartar sauce. Barbecue sauce. Teriyaki sauce. Soy sauce, including reduced sodium. Steak sauce. Fish sauce. Oyster sauce. Cocktail sauce. Horseradish. Ketchup and mustard. Meat flavorings and tenderizers. Bouillon cubes. Hot sauce. Tabasco sauce. Marinades. Taco seasonings. Relishes. Fats and Oils  Butter, stick margarine, lard, shortening, ghee, and bacon fat. Coconut, palm kernel, or palm oils. Regular salad dressings. Other  Pickles and olives. Salted popcorn and pretzels. The items listed above may not be a complete list of foods and beverages to avoid. Contact your dietitian for more information.  Where can I find more information? National Heart, Lung, and Blood Institute: CablePromo.itwww.nhlbi.nih.gov/health/health-topics/topics/dash/ This information is not intended to replace advice given to you by your health care provider. Make sure you discuss any questions you have with your health care provider. Document Released: 02/28/2011 Document  Revised: 08/17/2015 Document Reviewed: 01/13/2013 Elsevier Interactive Patient Education  2017 [employer]

## 2016-04-09 ENCOUNTER — Other Ambulatory Visit: Payer: Self-pay | Admitting: Family

## 2016-04-09 DIAGNOSIS — I10 Essential (primary) hypertension: Secondary | ICD-10-CM

## 2016-04-10 ENCOUNTER — Other Ambulatory Visit: Payer: Self-pay | Admitting: Family

## 2016-04-10 DIAGNOSIS — E785 Hyperlipidemia, unspecified: Secondary | ICD-10-CM

## 2016-04-11 ENCOUNTER — Ambulatory Visit: Payer: Self-pay | Admitting: Family Medicine

## 2016-04-15 ENCOUNTER — Ambulatory Visit (INDEPENDENT_AMBULATORY_CARE_PROVIDER_SITE_OTHER): Payer: Medicare Other | Admitting: Family Medicine

## 2016-04-15 ENCOUNTER — Encounter: Payer: Self-pay | Admitting: Family Medicine

## 2016-04-15 VITALS — BP 135/80 | HR 65 | Temp 98.0°F | Ht 70.0 in | Wt 231.6 lb

## 2016-04-15 DIAGNOSIS — M7551 Bursitis of right shoulder: Secondary | ICD-10-CM | POA: Diagnosis not present

## 2016-04-15 DIAGNOSIS — R51 Headache: Secondary | ICD-10-CM

## 2016-04-15 DIAGNOSIS — R519 Headache, unspecified: Secondary | ICD-10-CM

## 2016-04-15 NOTE — Progress Notes (Signed)
BP 135/80   Pulse 65   Temp 98 F (36.7 C) (Oral)   Ht 5\' 10"  (1.778 m)   Wt 231 lb 9.6 oz (105.1 kg)   BMI 33.23 kg/m    Subjective:    Patient ID: John Mejia, male    DOB: [birth year], 66 y.o.   MRN: [id number]  HPI: John Mejia is a 66 y.o. male presenting on 04/15/2016 for Shoulder Pain (right)   HPI Shoulder pain right  Patient has been having shoulder pain in his right shoulder for one year. It only hurts with certain movements. He says it does not hurt while just sitting there at rest. He says sometimes it does bother him at night if he moves in a certain way but not all the time. He says it does hurt him every day. He denies any fevers or chills or overlying skin changes. He denies any numbness or weakness.  Right-sided headache Patient has a right sided temporal headache that has been going on intermittently for past few months and they initially thought it might be from his sinuses but it has persisted despite his sinuses clearing up. He denies any visual changes or jaw pain. He does have shoulder pain on that side did not know if it could be radiated up from that. He denies any ear pain or hearing changes.  Relevant past medical, surgical, family and social history reviewed and updated as indicated. Interim medical history since our last visit reviewed. Allergies and medications reviewed and updated.  Review of Systems  Constitutional: Negative for chills and fever.  HENT: Negative for ear pain and hearing loss.   Eyes: Negative for pain and visual disturbance.  Respiratory: Negative for cough, shortness of breath and wheezing.   Cardiovascular: Negative for chest pain and leg swelling.  Musculoskeletal: Positive for arthralgias and myalgias. Negative for back pain and gait problem.  Skin: Negative for color change and rash.  Neurological: Positive for headaches. Negative for dizziness.  All other systems reviewed and are negative.   Per HPI unless specifically  indicated above     Objective:    BP 135/80   Pulse 65   Temp 98 F (36.7 C) (Oral)   Ht 5\' 10"  (1.778 m)   Wt 231 lb 9.6 oz (105.1 kg)   BMI 33.23 kg/m   Wt Readings from Last 3 Encounters:  04/15/16 231 lb 9.6 oz (105.1 kg)  04/08/16 234 lb (106.1 kg)  10/25/15 228 lb (103.4 kg)    Physical Exam  Constitutional: He is oriented to person, place, and time. He appears well-developed and well-nourished. No distress.  Eyes: Conjunctivae are normal. Right eye exhibits no discharge. Left eye exhibits no discharge. No scleral icterus.  Cardiovascular: Normal rate, regular rhythm, normal heart sounds and intact distal pulses.   No murmur heard. Pulmonary/Chest: Effort normal and breath sounds normal. No respiratory distress. He has no wheezes.  Musculoskeletal: Normal range of motion. He exhibits no edema.       Right shoulder: He exhibits tenderness (Tenderness only with overhead movement. All rotator cuff testing was normal). He exhibits normal range of motion, no swelling, no effusion, no crepitus, no deformity, no laceration, normal pulse and normal strength.  Neurological: He is alert and oriented to person, place, and time. Coordination normal.  Skin: Skin is warm and dry. No rash noted. He is not diaphoretic.  Psychiatric: He has a normal mood and affect. His behavior is normal.  Nursing note and vitals  reviewed.     Assessment & Plan:   Problem List Items Addressed This Visit    None    Visit Diagnoses    Subacromial bursitis of right shoulder joint    -  Primary   Recommend taking ibuprofen more regularly for a week or 2 and seeing if it will get rid of it completely.   Right sided temporal headache       Relevant Orders   High sensitivity CRP   Sedimentation rate       Follow up plan: Return if symptoms worsen or fail to improve.  Counseling provided for all of the vaccine components Orders Placed This Encounter  Procedures  . High sensitivity CRP  .  Sedimentation rate    Arville Care, MD [facility] 04/15/2016, 9:32 AM

## 2016-04-16 LAB — HIGH SENSITIVITY CRP: CRP, High Sensitivity: 1.89 mg/L (ref 0.00–3.00)

## 2016-04-16 LAB — SEDIMENTATION RATE: SED RATE: 2 mm/h (ref 0–30)

## 2016-07-07 ENCOUNTER — Other Ambulatory Visit: Payer: Self-pay | Admitting: Family

## 2016-07-07 DIAGNOSIS — E785 Hyperlipidemia, unspecified: Secondary | ICD-10-CM

## 2016-08-16 ENCOUNTER — Ambulatory Visit (INDEPENDENT_AMBULATORY_CARE_PROVIDER_SITE_OTHER): Payer: Medicare Other | Admitting: Family Medicine

## 2016-08-16 ENCOUNTER — Encounter: Payer: Self-pay | Admitting: Family Medicine

## 2016-08-16 VITALS — BP 135/87 | HR 73 | Temp 98.1°F | Ht 70.0 in | Wt 221.0 lb

## 2016-08-16 DIAGNOSIS — N200 Calculus of kidney: Secondary | ICD-10-CM

## 2016-08-16 LAB — MICROSCOPIC EXAMINATION: RENAL EPITHEL UA: NONE SEEN /HPF

## 2016-08-16 LAB — URINALYSIS, COMPLETE
Bilirubin, UA: NEGATIVE
Glucose, UA: NEGATIVE
Ketones, UA: NEGATIVE
Leukocytes, UA: NEGATIVE
Nitrite, UA: NEGATIVE
PH UA: 5.5 (ref 5.0–7.5)
Specific Gravity, UA: 1.02 (ref 1.005–1.030)
UUROB: 0.2 mg/dL (ref 0.2–1.0)

## 2016-08-16 MED ORDER — TAMSULOSIN HCL 0.4 MG PO CAPS: 0.4000 mg | ORAL_CAPSULE | Freq: Every day | ORAL | 3 refills | Status: DC

## 2016-08-16 MED ORDER — HYDROCODONE-ACETAMINOPHEN 5-325 MG PO TABS
1.0000 | ORAL_TABLET | Freq: Four times a day (QID) | ORAL | 0 refills | Status: DC | PRN
Start: 2016-08-16 — End: 2017-11-20

## 2016-08-16 MED ORDER — ONDANSETRON 4 MG PO TBDP: 4.0000 mg | ORAL_TABLET | Freq: Three times a day (TID) | ORAL | 0 refills | Status: DC | PRN

## 2016-08-16 NOTE — Progress Notes (Signed)
BP 135/87   Pulse 73   Temp 98.1 F (36.7 C) (Oral)   Ht 5\' 10"  (1.778 m)   Wt 221 lb (100.2 kg)   BMI 31.71 kg/m    Subjective:    Patient ID: John Mejia, male    DOB: 1950-11-18, 66 y.o.   MRN: [id number]  HPI: John Mejia is a 66 y.o. male presenting on 08/16/2016 for Nephrolithiasis (began about 3 weeks ago, improved, but came back again; history of kidney stones 8 years ago, patient had some vicodin left from then and has taken those for the pain)   HPI Left-sided abdominal pain and flank pain Patient comes in today with complaints of left-sided abdominal pain and flank pain that is similar to when he has previously had kidney stone issues. He says is been about 8 years since he had kidney stone issues. He denies any urinary frequency or decreased urination or blood in his urine today but did have some blood in his urine last week. He denies any fevers or chills or dysuria. He had one episode of vomiting but has had some nausea that has persisted. He is still making urine. He denies any diarrhea or constipation.  Relevant past medical, surgical, family and social history reviewed and updated as indicated. Interim medical history since our last visit reviewed. Allergies and medications reviewed and updated.  Review of Systems  Constitutional: Negative for chills and fever.  Respiratory: Negative for shortness of breath and wheezing.   Cardiovascular: Negative for chest pain and leg swelling.  Gastrointestinal: Positive for abdominal pain, nausea and vomiting. Negative for blood in stool, constipation and diarrhea.  Genitourinary: Positive for flank pain, frequency and hematuria. Negative for decreased urine volume, dysuria and urgency.  Musculoskeletal: Negative for back pain and gait problem.  Skin: Negative for rash.  All other systems reviewed and are negative.   Per HPI unless specifically indicated above        Objective:    BP 135/87   Pulse 73   Temp 98.1  F (36.7 C) (Oral)   Ht 5\' 10"  (1.778 m)   Wt 221 lb (100.2 kg)   BMI 31.71 kg/m   Wt Readings from Last 3 Encounters:  08/16/16 221 lb (100.2 kg)  04/15/16 231 lb 9.6 oz (105.1 kg)  04/08/16 234 lb (106.1 kg)    Physical Exam  Constitutional: He is oriented to person, place, and time. He appears well-developed and well-nourished. No distress.  Eyes: Conjunctivae are normal. No scleral icterus.  Cardiovascular: Normal rate, regular rhythm, normal heart sounds and intact distal pulses.   No murmur heard. Pulmonary/Chest: Effort normal and breath sounds normal. No respiratory distress. He has no wheezes.  Abdominal: Soft. Normal appearance and bowel sounds are normal. There is no hepatosplenomegaly. There is no tenderness (No tenderness on exam, patient says it is deeper). There is no rigidity, no rebound, no guarding and no CVA tenderness (No CVA tenderness on exam, patient's pain is deeper).  Musculoskeletal: Normal range of motion.  Neurological: He is alert and oriented to person, place, and time. Coordination normal.  Skin: Skin is warm and dry. No rash noted. He is not diaphoretic.  Psychiatric: He has a normal mood and affect. His behavior is normal.  Nursing note and vitals reviewed.     Assessment & Plan:   Problem List Items Addressed This Visit    None    Visit Diagnoses    Renal stones    -  Primary   Relevant Medications   HYDROcodone-acetaminophen (NORCO) 5-325 MG tablet   tamsulosin (FLOMAX) 0.4 MG CAPS capsule   ondansetron (ZOFRAN ODT) 4 MG disintegrating tablet   Other Relevant Orders   Urinalysis, Complete       Follow up plan: Return if symptoms worsen or fail to improve.  Counseling provided for all of the vaccine components Orders Placed This Encounter  Procedures  . Urinalysis, Complete    Arville Care, MD [facility] 08/16/2016, 10:32 AM

## 2016-08-29 ENCOUNTER — Telehealth: Payer: Self-pay | Admitting: Family Medicine

## 2016-08-29 ENCOUNTER — Ambulatory Visit (HOSPITAL_COMMUNITY)
Admission: RE | Admit: 2016-08-29 | Discharge: 2016-08-29 | Disposition: A | Discharge status: Home / Self Care | Payer: Medicare Other | Source: Ambulatory Visit | Attending: Family Medicine | Admitting: Family Medicine

## 2016-08-29 ENCOUNTER — Telehealth: Payer: Self-pay

## 2016-08-29 DIAGNOSIS — N132 Hydronephrosis with renal and ureteral calculous obstruction: Secondary | ICD-10-CM | POA: Diagnosis not present

## 2016-08-29 DIAGNOSIS — R109 Unspecified abdominal pain: Secondary | ICD-10-CM | POA: Insufficient documentation

## 2016-08-29 DIAGNOSIS — K5730 Diverticulosis of large intestine without perforation or abscess without bleeding: Secondary | ICD-10-CM | POA: Diagnosis not present

## 2016-08-29 DIAGNOSIS — N131 Hydronephrosis with ureteral stricture, not elsewhere classified: Secondary | ICD-10-CM

## 2016-08-29 DIAGNOSIS — N200 Calculus of kidney: Secondary | ICD-10-CM

## 2016-08-29 DIAGNOSIS — R933 Abnormal findings on diagnostic imaging of other parts of digestive tract: Secondary | ICD-10-CM | POA: Insufficient documentation

## 2016-08-29 NOTE — Telephone Encounter (Signed)
What type of referral do you need? Scan for kidney stones  Have you been seen at our office for this problem? yes (If no, schedule them an appointment.  They will need to be seen before a referral can be done.)  Is there a particular doctor or location that you prefer? Close to home  Patient notified that referrals can take up to a week or longer to process. If they haven't heard anything within a week they should call back and speak with the referral department.

## 2016-08-29 NOTE — Telephone Encounter (Signed)
Patient is still experiencing sporadic episodes of back and flank pain.  He would like to be set up for a CT scan to see if it is kidney stones again.  Please advise and route to pools.

## 2016-08-30 ENCOUNTER — Telehealth: Payer: Self-pay | Admitting: *Deleted

## 2016-08-30 DIAGNOSIS — N201 Calculus of ureter: Secondary | ICD-10-CM | POA: Diagnosis not present

## 2016-08-30 NOTE — Telephone Encounter (Signed)
Pt notified of results appt scheduled with [facility] today at 2:00

## 2016-08-30 NOTE — Telephone Encounter (Signed)
-----   Message from Nils PyleJoshua A Dettinger, MD sent at 08/30/2016  9:06 AM EDT ----- Here are the results for that CT scan K, patient needs an urgent urology referral or go to the emergency department. Thanks Arville CareJoshua Dettinger, MD [facility] 08/30/2016, 9:06 AM

## 2016-09-16 DIAGNOSIS — N201 Calculus of ureter: Secondary | ICD-10-CM | POA: Diagnosis not present

## 2016-09-28 ENCOUNTER — Other Ambulatory Visit: Payer: Self-pay | Admitting: Family

## 2016-09-30 NOTE — Telephone Encounter (Signed)
Last Vit D 10/25/15  42.8

## 2016-10-21 ENCOUNTER — Telehealth: Payer: Self-pay | Admitting: Family Medicine

## 2016-10-21 MED ORDER — SILDENAFIL CITRATE 100 MG PO TABS: 50.0000 mg | ORAL_TABLET | Freq: Every day | ORAL | 2 refills | Status: DC | PRN

## 2016-10-21 NOTE — Telephone Encounter (Signed)
Go ahead and send prescription refill for Viagra for him.

## 2016-10-21 NOTE — Telephone Encounter (Signed)
Please review and advise.

## 2016-10-21 NOTE — Telephone Encounter (Signed)
Pt aware of rx

## 2016-10-23 ENCOUNTER — Telehealth: Payer: Self-pay

## 2016-10-23 DIAGNOSIS — N529 Male erectile dysfunction, unspecified: Secondary | ICD-10-CM

## 2016-10-23 MED ORDER — SILDENAFIL CITRATE 20 MG PO TABS: ORAL_TABLET | ORAL | 3 refills | Status: DC

## 2016-10-23 NOTE — Addendum Note (Signed)
Addended by: Jannifer RodneyHAWKS, Kariel Skillman A on: 10/23/2016 07:16 PM   Modules accepted: Orders

## 2016-10-23 NOTE — Telephone Encounter (Signed)
There was a mix up of medication refill for patient - he has been requesting a refill of the sildenafil 20mg . The 100mg  is what was filled but patient states he does not need that one because it is too expensive.He states he went online and can get the 20mg  #90 cheaper than the 100mg . Is requesting a [facility] the 20mg  #[facility] [place]. Please advise and let patient know when it is ready. Joaquim LaiSaid Hawks was his provider.

## 2016-10-23 NOTE — Telephone Encounter (Signed)
Prescription sent to pharmacy.

## 2016-10-24 NOTE — Telephone Encounter (Signed)
Spoke with patient who states that through his rx program he can get #90 for the same price as 50. Called [facility] in [place] and changed amount per patients request

## 2016-12-24 ENCOUNTER — Other Ambulatory Visit: Payer: Self-pay | Admitting: Family Medicine

## 2016-12-24 NOTE — Telephone Encounter (Signed)
Last Vit D 10/25/15  42.8

## 2017-02-04 DIAGNOSIS — H538 Other visual disturbances: Secondary | ICD-10-CM | POA: Diagnosis not present

## 2017-02-04 DIAGNOSIS — H524 Presbyopia: Secondary | ICD-10-CM | POA: Diagnosis not present

## 2017-03-17 ENCOUNTER — Other Ambulatory Visit: Payer: Self-pay | Admitting: Family Medicine

## 2017-03-19 NOTE — Telephone Encounter (Signed)
He has completed his 12 week rx supplementation.  He should purchase OTC Vitamin D for continued maintenance.

## 2017-03-19 NOTE — Telephone Encounter (Signed)
Last seen 08/16/16  Dr D  Last Vit D 10/25/15  42.8

## 2017-03-28 ENCOUNTER — Other Ambulatory Visit: Payer: Self-pay | Admitting: Family

## 2017-03-28 DIAGNOSIS — I10 Essential (primary) hypertension: Secondary | ICD-10-CM

## 2017-03-31 NOTE — Telephone Encounter (Signed)
Last seen 08/16/16  Dr Dettinger

## 2017-04-09 ENCOUNTER — Encounter: Payer: Self-pay | Admitting: *Deleted

## 2017-04-09 ENCOUNTER — Ambulatory Visit (INDEPENDENT_AMBULATORY_CARE_PROVIDER_SITE_OTHER): Payer: Medicare Other | Admitting: *Deleted

## 2017-04-09 VITALS — BP 148/84 | HR 69 | Ht 68.0 in | Wt 228.0 lb

## 2017-04-09 DIAGNOSIS — Z0000 Encounter for general adult medical examination without abnormal findings: Secondary | ICD-10-CM | POA: Diagnosis not present

## 2017-04-09 NOTE — Patient Instructions (Signed)
  Mr. John Mejia , Thank you for taking time to come for your Medicare Wellness Visit. I appreciate your ongoing commitment to your health goals. Please review the following plan we discussed and let me know if I can assist you in the future.   These are the goals we discussed: Increase physical activity level. Aim for at least 30 minutes of moderate physical activity 5 days a week  This is a list of the screening recommended for you and due dates:  Health Maintenance  Topic Date Due  . Pneumonia vaccines (2 of 2 - PPSV23) 04/08/2017  . Colon Cancer Screening  04/21/2019  . Tetanus Vaccine  11/06/2024  . Flu Shot  Completed  .  Hepatitis C: One time screening is recommended by Center for Disease Control  (CDC) for  adults born from [birth year] through 1964.   Completed   We are referring you to a gastroenterologist for a colonoscopy. If you haven't heard from them in a week please call our referral department at [phone].

## 2017-04-09 NOTE — Progress Notes (Signed)
Subjective:   [name] is a 67 y.o. male who presents for an Initial Medicare Annual Wellness Visit. 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 He lives at home with his wife and they do not have children. 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   Review of Systems  Health is about the same as last year.   Cardiac Risk Factors include: advanced age (>3555men, 49>65 women);dyslipidemia;hypertension;male gender  Other systems negative today.    Objective:    Today's Vitals   04/09/17 1011  BP: (!) 148/84  Pulse: 69  Weight: 228 lb (103.4 kg)  Height: 5\' 8"  (1.727 m)   Body mass index is 34.67 kg/m.  Advanced Directives 04/09/2017  Does Patient Have a Medical Advance Directive? Yes  Type of Advance Directive Healthcare Power of Attorney  Copy of Healthcare Power of Attorney in Chart? Yes  Copy of Durable Power of Attorney made  Current Medications (verified) Outpatient Encounter Medications as of 04/09/2017  Medication Sig  . amLODipine (NORVASC) 10 MG tablet TAKE 1 TABLET (10 MG TOTAL) BY MOUTH DAILY.  Marland Kitchen. atorvastatin (LIPITOR) 10 MG tablet TAKE 1 TABLET (10 MG TOTAL) BY MOUTH DAILY.  Marland Kitchen. HYDROcodone-acetaminophen (NORCO) 5-325 MG tablet Take 1 tablet by mouth every 6 (six) hours as needed for moderate pain.  Marland Kitchen. ondansetron (ZOFRAN ODT) 4 MG disintegrating tablet Take 1 tablet (4 mg total) by mouth every 8 (eight) hours as needed for nausea or vomiting.  . sildenafil (REVATIO) 20 MG tablet Take 2-5 tables as needed for sexual activity  . tamsulosin (FLOMAX) 0.4 MG CAPS capsule Take 1 capsule (0.4 mg total) by mouth daily.  . Vitamin D, Ergocalciferol, (DRISDOL) 50000 units CAPS capsule TAKE 1 CAPSULE (50,000 UNITS TOTAL) BY MOUTH EVERY 7 (SEVEN) DAYS.   No facility-administered encounter medications on file as of 04/09/2017.      Allergies (verified) Patient has no known allergies.   History: Past Medical History:  Diagnosis Date  . Asthma   . Cataract   . Eczema   . Erectile dysfunction   . Hyperlipidemia   . Hypertension   . Multiple allergies   . Right-sided carotid artery disease [facility](HCC)    Past Surgical History:  Procedure Laterality Date  . COLONOSCOPY     2009  . VASECTOMY     Family History  Problem Relation Age of Onset  . Hypertension Father   . Stroke Father [id number]  . Heart disease Father        "Enlarged heart"  . Hypertension Mother   . Cancer Sister        ovarian cancer / fibroid tumors   Social History   Socioeconomic History  . Marital status: Married    Spouse name: Not on file  . Number of children: 0  . Years of education: some trade school  . Highest education level: Some college, no degree  Social Needs  . Financial resource strain: Not hard at all  . Food insecurity - worry: Never true  . Food insecurity - inability: Never true  . Transportation needs - medical: No  . Transportation needs - non-medical: No  Occupational History  . Occupation: farmer    Comment: Currently farming part time  . Occupation: Chartered certified accountantMachinist    Comment: retired  Tobacco Use  . Smoking status: Former Smoker  Packs/day: 3.00    Years: 10.00    Pack years: 30.00    Types: Cigarettes    Last attempt to quit: 03/25/1978    Years since quitting: 39.0  . Smokeless tobacco: Never Used  . Tobacco comment: Only smoked socially   Substance and Sexual Activity  . Alcohol use: Yes    Comment: occ.  . Drug use: No  . Sexual activity: Yes  Other Topics Concern  . Not on file  Social History Narrative   Patient is retired and lives at home with his wife. They do not have any children.    Tobacco Counseling No tobacco use      Clinical Intake:    Pain : No/denies pain    BMI - recorded: 34.67 Nutritional Status: BMI > 30  Obese Diabetes: No  Activities of Daily Living:  Independent Ambulation: Independent Medication Administration: Independent Home Management: Independent    Do you feel unsafe in your current relationship?: No Do you feel physically threatened by others?: No Anyone hurting you at home, work, or school?: No Unable to ask?: No Information provided on Community resources: No  How often do you need to have someone help you when you read instructions, pamphlets, or other written materials from your doctor or pharmacy?: 1 - Never What is the last grade level you completed in school?: 12 Grade with some technical/vocational training  Interpreter Needed?: No  Information entered by :: Demetrios Loll, RN  Activities of Daily Living In your present state of health, do you have any difficulty performing the following activities: 04/09/2017  Hearing? Y  Comment Some high pitched hearing loss. Is able to distinguish conversations.   Vision? N  Comment eye exam was in the fall of 2017. Sees Dr Conni Elliot in [place], [place]  Difficulty concentrating or making decisions? N  Walking or climbing stairs? N  Dressing or bathing? N  Doing errands, shopping? N  Preparing Food and eating ? N  Using the Toilet? N  In the past six months, have you accidently leaked urine? N  Do you have problems with loss of bowel control? N  Managing your Medications? N  Managing your Finances? N  Housekeeping or managing your Housekeeping? N  Some recent data might be hidden     Immunizations and Health Maintenance Immunization History  Administered Date(s) Administered  . Influenza, High Dose Seasonal PF 04/08/2016, 03/19/2017  . Influenza,inj,Quad PF,6+ Mos 03/01/2015  . Pneumococcal Conjugate-13 04/08/2016  . Tdap 11/07/2014   Health Maintenance Due  Topic Date Due  . PNA vac Low Risk Adult (2 of 2 - PPSV23) 04/08/2017    Patient Care Team: Dettinger, Elige Radon, MD as PCP - General (Family Medicine)     Assessment:   This is a routine wellness examination for  [facility].  Hearing/Vision screen No deficits noted during visit. Eye exam is up to date.   Dietary issues and exercise activities discussed: Current Exercise Habits: The patient does not participate in regular exercise at present, Exercise limited by: None identified  Goals Increase physical activity. Aim for 150 minutes of moderate activity a week in addition to regular physical activity that you gain from working outdoors.   Depression Screen PHQ 2/9 Scores 04/09/2017 08/16/2016 04/15/2016 04/08/2016  PHQ - 2 Score 0 0 0 0    Fall Risk Fall Risk  04/09/2017 08/16/2016 04/15/2016 04/08/2016 10/25/2015  Falls in the past year? No No Yes Yes No  Comment - - - patient jumped on large  hay roll and fell off -  Number falls in past yr: - - 1 1 -  Injury with Fall? - - - No -    Cognitive Function: MMSE - Mini Mental State Exam 04/09/2017 04/08/2016  Orientation to time 5 5  Orientation to Place 5 5  Registration 3 3  Attention/ Calculation 5 4  Recall 3 3  Language- name 2 objects 2 2  Language- repeat 1 1  Language- follow 3 step command 3 3  Language- read & follow direction 1 1  Write a sentence 1 1  Copy design 1 1  Total score 30 29    Normal exam    Screening Tests Health Maintenance  Topic Date Due  . PNA vac Low Risk Adult (2 of 2 - PPSV23) 04/08/2017  . COLONOSCOPY  04/21/2019  . TETANUS/TDAP  11/06/2024  . INFLUENZA VACCINE  Completed  . Hepatitis C Screening  Completed       Plan:  Increase physical activity. Aim for at least of moderate activity a week Consider pneumovax Referral to GI for colonoscopy  Complete physical scheduled with Dr Dettinger for 04/2017.  I have personally reviewed and noted the following in the patient's chart:   . Medical and social history . Use of alcohol, tobacco or illicit drugs  . Current medications and supplements . Functional ability and status . Nutritional status . Physical activity . Advanced directives . List of  other physicians . Hospitalizations, surgeries, and ER visits in previous 12 months . Vitals . Screenings to include cognitive, depression, and falls . Referrals and appointments  In addition, I have reviewed and discussed with patient certain preventive protocols, quality metrics, and best practice recommendations. A written personalized care plan for preventive services as well as general preventive health recommendations were provided to patient.     Demetrios Loll, RN   04/09/2017

## 2017-05-12 ENCOUNTER — Encounter: Payer: Self-pay | Admitting: Family Medicine

## 2017-05-12 ENCOUNTER — Ambulatory Visit (INDEPENDENT_AMBULATORY_CARE_PROVIDER_SITE_OTHER): Payer: Medicare Other | Admitting: Family Medicine

## 2017-05-12 VITALS — BP 133/77 | HR 69 | Temp 96.9°F | Ht 68.0 in | Wt 225.0 lb

## 2017-05-12 DIAGNOSIS — N529 Male erectile dysfunction, unspecified: Secondary | ICD-10-CM | POA: Diagnosis not present

## 2017-05-12 DIAGNOSIS — Z1211 Encounter for screening for malignant neoplasm of colon: Secondary | ICD-10-CM | POA: Diagnosis not present

## 2017-05-12 DIAGNOSIS — E782 Mixed hyperlipidemia: Secondary | ICD-10-CM | POA: Diagnosis not present

## 2017-05-12 DIAGNOSIS — I10 Essential (primary) hypertension: Secondary | ICD-10-CM | POA: Diagnosis not present

## 2017-05-12 DIAGNOSIS — Z131 Encounter for screening for diabetes mellitus: Secondary | ICD-10-CM

## 2017-05-12 DIAGNOSIS — Z125 Encounter for screening for malignant neoplasm of prostate: Secondary | ICD-10-CM | POA: Diagnosis not present

## 2017-05-12 DIAGNOSIS — Z23 Encounter for immunization: Secondary | ICD-10-CM | POA: Diagnosis not present

## 2017-05-12 NOTE — Progress Notes (Signed)
BP 133/77   Pulse 69   Temp (!) 96.9 F (36.1 C) (Oral)   Ht '5\' 8"'  (1.727 m)   Wt 225 lb (102.1 kg)   BMI 34.21 kg/m    Subjective:    Patient ID: John Mejia, male    DOB: May 20, 1950, 67 y.o.   MRN: [id number]  HPI: John Mejia is a 67 y.o. male presenting on 05/12/2017 for Annual Exam (wants labs, wants PSA checked)   HPI Hypertension Patient is currently on amlodipine, and their blood pressure today is 133/77. Patient denies any lightheadedness or dizziness. Patient denies headaches, blurred vision, chest pains, shortness of breath, or weakness. Denies any side effects from medication and is content with current medication.   Hyperlipidemia Patient is coming in for recheck of his hyperlipidemia. The patient is currently taking Lipitor. They deny any issues with myalgias or history of liver damage from it. They deny any focal numbness or weakness or chest pain.   Rectal dysfunction and incomplete urinary emptying Patient comes in today complaining of increased erectile dysfunction and says that 60 mg of sildenafil is not working as well as it was previously.  He would like to have his prostate checked because he was concerned about it.  He is having some incomplete emptying and he wakes up a couple times a night but is not too concerned about that and is more concerned about the difficulty with erections.  He can obtain an erection but cannot always finish with a good ejaculation.  Relevant past medical, surgical, family and social history reviewed and updated as indicated. Interim medical history since our last visit reviewed. Allergies and medications reviewed and updated.  Review of Systems  Constitutional: Negative for chills and fever.  Respiratory: Negative for shortness of breath and wheezing.   Cardiovascular: Negative for chest pain and leg swelling.  Genitourinary: Positive for difficulty urinating. Negative for decreased urine volume, dysuria, hematuria and  urgency.  Musculoskeletal: Negative for back pain and gait problem.  Skin: Negative for rash.  Neurological: Negative for dizziness, weakness, light-headedness and numbness.  All other systems reviewed and are negative.   Per HPI unless specifically indicated above   Allergies as of 05/12/2017   No Known Allergies     Medication List        Accurate as of 05/12/17 10:37 AM. Always use your most recent med list.          amLODipine 10 MG tablet Commonly known as:  NORVASC TAKE 1 TABLET (10 MG TOTAL) BY MOUTH DAILY.   atorvastatin 10 MG tablet Commonly known as:  LIPITOR TAKE 1 TABLET (10 MG TOTAL) BY MOUTH DAILY.   HYDROcodone-acetaminophen 5-325 MG tablet Commonly known as:  NORCO Take 1 tablet by mouth every 6 (six) hours as needed for moderate pain.   multivitamin with minerals tablet Take 1 tablet by mouth daily.   ondansetron 4 MG disintegrating tablet Commonly known as:  ZOFRAN ODT Take 1 tablet (4 mg total) by mouth every 8 (eight) hours as needed for nausea or vomiting.   sildenafil 20 MG tablet Commonly known as:  REVATIO Take 2-5 tables as needed for sexual activity   tamsulosin 0.4 MG Caps capsule Commonly known as:  FLOMAX Take 1 capsule (0.4 mg total) by mouth daily.          Objective:    BP 133/77   Pulse 69   Temp (!) 96.9 F (36.1 C) (Oral)   Ht '5\' 8"'  (1.727  m)   Wt 225 lb (102.1 kg)   BMI 34.21 kg/m   Wt Readings from Last 3 Encounters:  05/12/17 225 lb (102.1 kg)  04/09/17 228 lb (103.4 kg)  08/16/16 221 lb (100.2 kg)    Physical Exam  Constitutional: He is oriented to person, place, and time. He appears well-developed and well-nourished. No distress.  Eyes: Conjunctivae are normal. No scleral icterus.  Neck: Neck supple. No thyromegaly present.  Cardiovascular: Normal rate, regular rhythm, normal heart sounds and intact distal pulses.  No murmur heard. Pulmonary/Chest: Effort normal and breath sounds normal. No respiratory  distress. He has no wheezes.  Genitourinary: Rectum normal and penis normal. Prostate is enlarged (No lumps or nodules noted). Prostate is not tender.  Musculoskeletal: Normal range of motion. He exhibits no edema.  Lymphadenopathy:    He has no cervical adenopathy.  Neurological: He is alert and oriented to person, place, and time. Coordination normal.  Skin: Skin is warm and dry. No rash noted. He is not diaphoretic.  Psychiatric: He has a normal mood and affect. His behavior is normal.  Nursing note and vitals reviewed.       Assessment & Plan:   Problem List Items Addressed This Visit      Cardiovascular and Mediastinum   Hypertension - Primary   Relevant Orders   CMP14+EGFR     Genitourinary   Erectile dysfunction   Relevant Orders   PSA, total and free     Other   Hyperlipidemia   Relevant Orders   Lipid panel    Other Visit Diagnoses    Prostate cancer screening       Relevant Orders   PSA, total and free   Screen for colon cancer       Relevant Orders   Ambulatory referral to Gastroenterology       Follow up plan: No Follow-up on file.  Counseling provided for all of the vaccine components Orders Placed This Encounter  Procedures  . CMP14+EGFR  . Lipid panel  . PSA, total and free    Caryl Pina, MD [facility] 05/12/2017, 10:37 AM

## 2017-05-12 NOTE — Addendum Note (Signed)
Addended by: Lorelee CoverOSTOSKY, JESSICA C on: 05/12/2017 11:33 AM   Modules accepted: Orders

## 2017-05-13 LAB — CMP14+EGFR
A/G RATIO: 1.6 (ref 1.2–2.2)
ALT: 20 IU/L (ref 0–44)
AST: 23 IU/L (ref 0–40)
Albumin: 4.4 g/dL (ref 3.6–4.8)
Alkaline Phosphatase: 80 IU/L (ref 39–117)
BUN/Creatinine Ratio: 8 — ABNORMAL LOW (ref 10–24)
BUN: 8 mg/dL (ref 8–27)
Bilirubin Total: 0.4 mg/dL (ref 0.0–1.2)
CALCIUM: 10.8 mg/dL — AB (ref 8.6–10.2)
CHLORIDE: 108 mmol/L — AB (ref 96–106)
CO2: 21 mmol/L (ref 20–29)
Creatinine, Ser: 1 mg/dL (ref 0.76–1.27)
GFR calc Af Amer: 90 mL/min/{1.73_m2} (ref >59)
GFR, EST NON AFRICAN AMERICAN: 78 mL/min/{1.73_m2} (ref >59)
Globulin, Total: 2.7 g/dL (ref 1.5–4.5)
Glucose: 104 mg/dL — ABNORMAL HIGH (ref 65–99)
POTASSIUM: 4.5 mmol/L (ref 3.5–5.2)
Sodium: 144 mmol/L (ref 134–144)
Total Protein: 7.1 g/dL (ref 6.0–8.5)

## 2017-05-13 LAB — LIPID PANEL
CHOLESTEROL TOTAL: 105 mg/dL (ref 100–199)
Chol/HDL Ratio: 2.1 ratio (ref 0.0–5.0)
HDL: 51 mg/dL (ref >39)
LDL Calculated: 47 mg/dL (ref 0–99)
TRIGLYCERIDES: 37 mg/dL (ref 0–149)
VLDL Cholesterol Cal: 7 mg/dL (ref 5–40)

## 2017-05-13 LAB — PSA, TOTAL AND FREE
PSA, Free Pct: 20.8 %
PSA, Free: 0.25 ng/mL
Prostate Specific Ag, Serum: 1.2 ng/mL (ref 0.0–4.0)

## 2017-05-14 ENCOUNTER — Other Ambulatory Visit: Payer: Self-pay

## 2017-05-14 DIAGNOSIS — Z131 Encounter for screening for diabetes mellitus: Secondary | ICD-10-CM

## 2017-05-16 DIAGNOSIS — Z131 Encounter for screening for diabetes mellitus: Secondary | ICD-10-CM | POA: Diagnosis not present

## 2017-05-16 DIAGNOSIS — R7989 Other specified abnormal findings of blood chemistry: Secondary | ICD-10-CM | POA: Diagnosis not present

## 2017-05-16 LAB — BAYER DCA HB A1C WAIVED: HB A1C: 5.1 % (ref <7.0)

## 2017-05-16 NOTE — Addendum Note (Signed)
Addended by: Margorie JohnJOHNSON, Ronniesha Seibold M on: 05/16/2017 03:36 PM   Modules accepted: Orders

## 2017-05-21 ENCOUNTER — Telehealth: Payer: Self-pay | Admitting: Family Medicine

## 2017-05-21 NOTE — Telephone Encounter (Signed)
Patient aware of lab results.

## 2017-07-14 ENCOUNTER — Other Ambulatory Visit: Payer: Self-pay | Admitting: Family

## 2017-07-14 DIAGNOSIS — I10 Essential (primary) hypertension: Secondary | ICD-10-CM

## 2017-07-14 DIAGNOSIS — E785 Hyperlipidemia, unspecified: Secondary | ICD-10-CM

## 2017-08-11 DIAGNOSIS — Z1211 Encounter for screening for malignant neoplasm of colon: Secondary | ICD-10-CM | POA: Diagnosis not present

## 2017-10-29 ENCOUNTER — Other Ambulatory Visit: Payer: Self-pay | Admitting: Family

## 2017-10-29 DIAGNOSIS — N529 Male erectile dysfunction, unspecified: Secondary | ICD-10-CM

## 2017-10-30 NOTE — Telephone Encounter (Signed)
Last seen 05/12/17   

## 2017-11-06 ENCOUNTER — Other Ambulatory Visit: Payer: Self-pay

## 2017-11-06 NOTE — Patient Outreach (Signed)
[facility] Care Management  11/06/2017  [facility] [name] January 28, 1951 [id number]   Medication Adherence call to [name] patient is due on Atorvastatin 10 mg spoke with patient he has stop taking Atorvastatin 10 mg because  of side effects he also said he is only taking it every other day. John Mejia is showing past due under [employer].  Lillia AbedAna Ollison-Moran CPhT Pharmacy Technician [facility] Care Management Direct Dial [phone]  Fax [phone] [email or web address]

## 2017-11-20 ENCOUNTER — Ambulatory Visit (INDEPENDENT_AMBULATORY_CARE_PROVIDER_SITE_OTHER): Payer: Medicare Other | Admitting: Family Medicine

## 2017-11-20 ENCOUNTER — Encounter: Payer: Self-pay | Admitting: Family Medicine

## 2017-11-20 VITALS — BP 133/87 | HR 70 | Temp 97.4°F | Ht 68.0 in | Wt 222.6 lb

## 2017-11-20 DIAGNOSIS — M7042 Prepatellar bursitis, left knee: Secondary | ICD-10-CM | POA: Diagnosis not present

## 2017-11-20 DIAGNOSIS — S6992XA Unspecified injury of left wrist, hand and finger(s), initial encounter: Secondary | ICD-10-CM | POA: Diagnosis not present

## 2017-11-20 MED ORDER — DICLOFENAC SODIUM 1 % TD GEL: 2.0000 g | Freq: Four times a day (QID) | TRANSDERMAL | 2 refills | Status: DC

## 2017-11-20 NOTE — Progress Notes (Signed)
BP 133/87   Pulse 70   Temp (!) 97.4 F (36.3 C) (Oral)   Ht 5\' 8"  (1.727 m)   Wt 222 lb 9.6 oz (101 kg)   BMI 33.85 kg/m    Subjective:    Patient ID: John Mejia, male    DOB: September 12, 1950, 67 y.o.   MRN: [id number]  HPI: John Mejia is a 67 y.o. male presenting on 11/20/2017 for Hand Pain (Left ring finger. Patient states he jamed his left ring finger 3 months ago and is still having pain and swelling.) and Knee Pain (x 3 weeks. Patient states he fell on his knee x 3 weeks ago and states that it is still achy.)   HPI Jammed left ring finger Patient is coming in with jammed left ring finger that is still bothering him.  He says it is still slightly swollen and aches and when he moves it but does not have any pain at rest.  He says it happened 3 months ago he was just concerned because it still is that way.  He was trying to control 1 of his new calves and that was what caused his finger to be jammed.  He denies any redness or warmth but just says it mainly aches on his left ring finger on the proximal interphalangeal joint.  He says the pain is very mild and more of an achy sensation.  Left knee swelling and pain Patient has left knee pain and swelling that has been bothering him over the past 3 weeks.  He says he was hit behind by 1 of his calves and knocked forward onto his knees and that was what started causing him his issue with that knee.  He says it is swollen just above his kneecap on that left knee.  He says it mildly aches and is not as painful as before but he just still has a lot of swelling there and thus when he was concerned about.  He denies any fevers or chills or redness or warmth.  Relevant past medical, surgical, family and social history reviewed and updated as indicated. Interim medical history since our last visit reviewed. Allergies and medications reviewed and updated.  Review of Systems  Constitutional: Negative for chills and fever.  Respiratory: Negative  for shortness of breath and wheezing.   Cardiovascular: Negative for chest pain and leg swelling.  Musculoskeletal: Positive for arthralgias and joint swelling. Negative for back pain and gait problem.  Skin: Negative for color change and rash.  All other systems reviewed and are negative.   Per HPI unless specifically indicated above   Allergies as of 11/20/2017   No Known Allergies     Medication List        Accurate as of 11/20/17  1:58 PM. Always use your most recent med list.          amLODipine 10 MG tablet Commonly known as:  NORVASC TAKE 1 TABLET (10 MG TOTAL) BY MOUTH DAILY.   atorvastatin 10 MG tablet Commonly known as:  LIPITOR TAKE 1 TABLET (10 MG TOTAL) BY MOUTH DAILY.   diclofenac sodium 1 % Gel Commonly known as:  VOLTAREN Apply 2 g topically 4 (four) times daily.   sildenafil 20 MG tablet Commonly known as:  REVATIO TAKE 2-5 TABLETS BY MOUTH AS NEEDED FOR SEXUAL ACTIVITY          Objective:    BP 133/87   Pulse 70   Temp (!) 97.4 F (36.3  C) (Oral)   Ht 5\' 8"  (1.727 m)   Wt 222 lb 9.6 oz (101 kg)   BMI 33.85 kg/m   Wt Readings from Last 3 Encounters:  11/20/17 222 lb 9.6 oz (101 kg)  05/12/17 225 lb (102.1 kg)  04/09/17 228 lb (103.4 kg)    Physical Exam  Constitutional: He is oriented to person, place, and time. He appears well-developed and well-nourished. No distress.  Eyes: Conjunctivae are normal. No scleral icterus.  Musculoskeletal: Normal range of motion. He exhibits no edema.       Left knee: He exhibits swelling (Swelling just above the left knee, consistent with bursitis, no erythema or warmth or tenderness). He exhibits normal range of motion, no ecchymosis, no deformity and normal alignment.       Left hand: He exhibits normal range of motion, no tenderness, normal capillary refill and no deformity. Normal sensation noted. Normal strength noted.       Hands: Neurological: He is alert and oriented to person, place, and time.  Coordination normal.  Skin: Skin is warm and dry. No rash noted. He is not diaphoretic.  Psychiatric: He has a normal mood and affect. His behavior is normal.  Nursing note and vitals reviewed.       Assessment & Plan:   Problem List Items Addressed This Visit    None    Visit Diagnoses    Prepatellar bursitis of left knee    -  Primary   Larey Seat just wrong, from trauma, no warmth or erythema, pain minimal, wants to treat conservatively for now   Relevant Medications   diclofenac sodium (VOLTAREN) 1 % GEL   Jammed finger (interphalangeal joint), left, initial encounter       Left ring finger   Relevant Medications   diclofenac sodium (VOLTAREN) 1 % GEL       Follow up plan: Return if symptoms worsen or fail to improve.  Counseling provided for all of the vaccine components No orders of the defined types were placed in this encounter.   Arville Care, MD [facility] 11/20/2017, 1:58 PM

## 2017-11-20 NOTE — Patient Instructions (Signed)
Prepatellar Bursitis Prepatellar bursitis is inflammation of the prepatellar bursa. The prepatellar bursa is a fluid-filled sac that cushions the kneecap (patella). Prepatellar bursitis happens when fluid builds up in the this sac and causes it to get larger. The condition causes knee pain. What are the causes? This condition may be caused by:  Constant pressure on the knees from kneeling.  A hit to the knee.  Falling on the knee.  A bacterial infection.  Moving the knee often in a forceful way.  What increases the risk? This condition is more likely to develop in:  People who play a sport that involves a risk for falls on the knee or hard hits (blows) to the knee. These sports include: ? Football. ? Wrestling. ? Basketball. ? Soccer.  People who have to kneel for long periods of time, such as roofers, plumbers, and gardeners.  People with another inflammatory condition, such as gout or rheumatoid arthritis.  What are the signs or symptoms? The most common symptom of this condition is knee pain that gets better with rest. Other symptoms include:  Swelling on the front of the kneecap.  Warmth in the knee.  Tenderness with activity.  Redness in the knee.  Inability to bend the knee or to kneel.  How is this diagnosed? This condition may be diagnosed based on:  Your symptoms.  Your medical history.  A physical exam. During the exam, your provider will compare your knees and check for tenderness and pain when moving your knee. Your health care provider may also use a needle to remove fluid from the bursa to help diagnose an infection.  Tests, such as: ? A blood test that checks for infection. ? X-rays. These may be taken to check the structure of the patella. ? MRI or ultrasound. These may be done to check for swelling and fluid buildup in the bursa.  How is this treated? This condition may be treated by:  Resting the knee.  Applying ice to the  knee.  Medicine, such as: ? Nonsteroidal anti-inflammatory drugs (NSAIDs). These can help to reduce pain and swelling. ? Antibiotic medicines. These may be needed if you have an infection. ? Steroid medicines. These may be prescribed if other treatments are not helping.  Raising (elevating) the knee while resting.  Doing strengthening and stretching exercises (physical therapy). These may be recommended after pain and swelling improve.  Having a procedure to remove fluid from the bursa. This may be done if other treatment is not helping.  Having surgery to remove the bursa. This may be done if you have a severe infection or if the condition keeps coming back after treatment.  Follow these instructions at home: Medicines  Take over-the-counter and prescription medicines only as told by your health care provider.  If you were prescribed an antibiotic medicine, take it as told by your health care provider. Do not stop taking the antibiotic even if you start to feel better. Managing pain, stiffness, and swelling  If directed, apply ice to your knee. ? Put ice in a plastic bag. ? Place a towel between your skin and the bag. ? Leave the ice on for 20 minutes, 2-3 times a day.  Elevate your knee above the level of your heart while you are sitting or lying down. Driving  Do not drive or operate heavy machinery while taking prescription pain medicine.  Ask your health care provider when it is safe fpr you to drive. Activity  Rest your knee.    Avoid activities that cause pain.  Return to your normal activities as told by your health care provider. Ask your health care provider what activities are safe for you.  Do exercises as told by your health care provider. General instructions  Do not use the injured limb to support your body weight until your health care provider says that you can.  Do not use any tobacco products, such as cigarettes, chewing tobacco, and e-cigarettes.  Tobacco can delay bone healing. If you need help quitting, ask your health care provider.  Keep all follow-up visits as told by your health care provider. This is important. How is this prevented?  Warm up and stretch before being active.  Cool down and stretch after being active.  Give your body time to rest between periods of activity.  Make sure to use equipment that fits you.  Be safe and responsible while being active to avoid falls.  Do at least 150 minutes of moderate-intensity exercise each week, such as brisk walking or water aerobics.  Maintain physical fitness, including: ? Strength. ? Flexibility. ? Cardiovascular fitness. ? Endurance. Contact a health care provider if:  Your symptoms do not improve.  Your symptoms get worse.  Your symptoms keep coming back after treatment.  You develop a fever and have warmth, redness, and swelling over your knee. This information is not intended to replace advice given to you by your health care provider. Make sure you discuss any questions you have with your health care provider. Document Released: 03/11/2005 Document Revised: 11/14/2015 Document Reviewed: 12/09/2014 Elsevier Interactive Patient Education  2018 Elsevier Inc.  

## 2018-02-05 DIAGNOSIS — H538 Other visual disturbances: Secondary | ICD-10-CM | POA: Diagnosis not present

## 2018-02-05 DIAGNOSIS — H524 Presbyopia: Secondary | ICD-10-CM | POA: Diagnosis not present

## 2018-03-01 ENCOUNTER — Other Ambulatory Visit: Payer: Self-pay | Admitting: Family

## 2018-03-01 DIAGNOSIS — E785 Hyperlipidemia, unspecified: Secondary | ICD-10-CM

## 2018-03-02 NOTE — Telephone Encounter (Signed)
Last seen 05/12/17   

## 2018-03-22 ENCOUNTER — Other Ambulatory Visit: Payer: Self-pay | Admitting: Family

## 2018-03-22 DIAGNOSIS — I10 Essential (primary) hypertension: Secondary | ICD-10-CM

## 2018-04-16 ENCOUNTER — Encounter: Payer: Self-pay | Admitting: *Deleted

## 2018-04-16 ENCOUNTER — Ambulatory Visit (INDEPENDENT_AMBULATORY_CARE_PROVIDER_SITE_OTHER): Payer: Medicare Other | Admitting: *Deleted

## 2018-04-16 VITALS — BP 137/85 | HR 67 | Ht 68.0 in | Wt 225.0 lb

## 2018-04-16 DIAGNOSIS — Z23 Encounter for immunization: Secondary | ICD-10-CM | POA: Diagnosis not present

## 2018-04-16 DIAGNOSIS — N529 Male erectile dysfunction, unspecified: Secondary | ICD-10-CM

## 2018-04-16 DIAGNOSIS — Z0000 Encounter for general adult medical examination without abnormal findings: Secondary | ICD-10-CM | POA: Diagnosis not present

## 2018-04-16 DIAGNOSIS — H9190 Unspecified hearing loss, unspecified ear: Secondary | ICD-10-CM

## 2018-04-16 MED ORDER — SILDENAFIL CITRATE 20 MG PO TABS: ORAL_TABLET | ORAL | 0 refills | Status: DC

## 2018-04-16 NOTE — Progress Notes (Signed)
Subjective:   [facility] John Mejia is a 68 y.o. male who presents for Medicare Annual/Subsequent preventive examination.  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  He was also in the [employer] for 3 years when he was in his early 60s.  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  He lives at home with his wife, they do not have children.  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  John Mejia feels his health has improved over the last year because his finger that he injured and knees are better.  He reports no ER visits, hospitalizations, or surgeries in the past year.   Review of Systems:   All systems negative today  Cardiac Risk Factors include: advanced age (>60men, >38 women);dyslipidemia;family history of premature cardiovascular disease;hypertension;male gender     Objective:    Vitals: BP 137/85   Pulse 67   Ht 5\' 8"  (1.727 m)   Wt 225 lb (102.1 kg)   BMI 34.21 kg/m   Body mass index is 34.21 kg/m.  Advanced Directives 04/16/2018 04/09/2017  Does Patient Have a Medical Advance Directive? Yes Yes  Type of Estate agent of [place];Living will Healthcare Power of Attorney  Copy of Healthcare Power of Attorney in Chart? Yes - validated most recent copy scanned in chart (See row information) Yes    Tobacco Social History   Tobacco Use  Smoking Status Former Smoker  . Packs/day: 0.10  . Years: 4.00  . Pack years: 0.40  . Types: Cigarettes  . Last attempt to quit: 03/25/1978  . Years since quitting: 40.0  Smokeless Tobacco Never Used  Tobacco Comment   Only smoked socially      Counseling given: No Comment: Only smoked socially    Clinical Intake:     Pain Score: 0-No pain                 Past Medical History:  Diagnosis Date  . Asthma   . Cataract   . Eczema   . Erectile dysfunction   . Hyperlipidemia   . Hypertension   . Multiple allergies   . Right-sided carotid artery disease  [facility]    Past Surgical History:  Procedure Laterality Date  . COLONOSCOPY     2009  . VASECTOMY     Family History  Problem Relation Age of Onset  . Hypertension Father   . Stroke Father 23  . Heart disease Father        "Enlarged heart"  . Hypertension Mother   . Cancer Sister        ovarian cancer / fibroid tumors   Social History   Socioeconomic History  . Marital status: Married    Spouse name: Not on file  . Number of children: 0  . Years of education: some trade school  . Highest education level: Some college, no degree  Occupational History  . Occupation: farmer    Comment: Currently farming part time  . Occupation: Chartered certified accountant    Comment: retired  Engineer, production  . Financial resource strain: Not hard at all  . Food insecurity:    Worry: Never true    Inability: Never true  . Transportation needs:    Medical: No    Non-medical: No  Tobacco Use  . Smoking status: Former Smoker    Packs/day: 0.10    Years: 4.00    Pack years: 0.40  Types: Cigarettes    Last attempt to quit: 03/25/1978    Years since quitting: 40.0  . Smokeless tobacco: Never Used  . Tobacco comment: Only smoked socially   Substance and Sexual Activity  . Alcohol use: Not Currently  . Drug use: No  . Sexual activity: Yes  Lifestyle  . Physical activity:    Days per week: 0 days    Minutes per session: 0 min  . Stress: To some extent  Relationships  . Social connections:    Talks on phone: More than three times a week    Gets together: More than three times a week    Attends religious service: More than 4 times per year    Active member of club or organization: Yes    Attends meetings of clubs or organizations: More than 4 times per year    Relationship status: Married  Other Topics Concern  . Not on file  Social History Narrative   Patient is retired and lives at home with his wife. They do not have any children.     Outpatient Encounter Medications as of 04/16/2018  Medication  Sig  . amLODipine (NORVASC) 10 MG tablet TAKE 1 TABLET (10 MG TOTAL) BY MOUTH DAILY.  Marland Kitchen. atorvastatin (LIPITOR) 10 MG tablet TAKE 1 TABLET (10 MG TOTAL) BY MOUTH DAILY.  Marland Kitchen. diclofenac sodium (VOLTAREN) 1 % GEL Apply 2 g topically 4 (four) times daily.  . sildenafil (REVATIO) 20 MG tablet TAKE 2-5 TABLETS BY MOUTH AS NEEDED FOR SEXUAL ACTIVITY  . [DISCONTINUED] sildenafil (REVATIO) 20 MG tablet TAKE 2-5 TABLETS BY MOUTH AS NEEDED FOR SEXUAL ACTIVITY   No facility-administered encounter medications on file as of 04/16/2018.     Activities of Daily Living In your present state of health, do you have any difficulty performing the following activities: 04/16/2018  Hearing? Y  Comment Decreased hearing both ears due to work exposure.  Interested in audiology referral  Vision? N  Difficulty concentrating or making decisions? N  Walking or climbing stairs? N  Dressing or bathing? N  Doing errands, shopping? N  Preparing Food and eating ? N  Using the Toilet? N  In the past six months, have you accidently leaked urine? N  Do you have problems with loss of bowel control? N  Managing your Medications? N  Managing your Finances? N  Housekeeping or managing your Housekeeping? N  Some recent data might be hidden    Patient Care Team: Dettinger, Elige RadonJoshua A, MD as PCP - General (Family Medicine) Rollene RotundaHochrein, James, MD as Consulting Physician (Cardiology)   Assessment:   This [facility] routine wellness [facility]  Exercise Activities and Dietary recommendations  Mr. John Mejia states he usually eats 2 meals per day and a snack.  He usually has eggs, bacon, toast and coffee for breakfast, a snack midday and meat and vegetables for supper.  Recommended decreasing processed meats like bacon and sausage to once per week.  Also, recommended a diet of mostly non-starchy vegetables, fruits, lean proteins, and whole grains. Mr. John Mejia has access to all the food he needs.  His wife prepares most of their meals at  home.  Healthy cooking methods discussed.   Current Exercise Habits: Home exercise routine, Type of exercise: strength training/weights, Time (Minutes): 60, Frequency (Times/Week): 2, Weekly Exercise (Minutes/Week): 120, Intensity: Moderate  Goals    . Exercise 150 min/wk Moderate Activity     Using your total gym is a great option.  Fall Risk Fall Risk  04/16/2018 11/20/2017 05/12/2017 04/09/2017 08/16/2016  Falls in the past year? 0 No No No No  Comment - - - - -  Number falls in past yr: - - - - -  Injury with Fall? - - - - -   Is the patient's home free of loose throw rugs in walkways, pet beds, electrical cords, etc?   yes      Grab bars in the bathroom? no      Handrails on the stairs?   yes      Adequate lighting?   yes    Depression Screen PHQ 2/9 Scores 04/16/2018 11/20/2017 05/12/2017 04/09/2017  PHQ - 2 Score 0 0 0 0    Cognitive Function MMSE - Mini Mental State Exam 04/16/2018 04/09/2017 04/08/2016  Orientation to time 5 5 5   Orientation to Place 5 5 5   Registration 3 3 3   Attention/ Calculation 5 5 4   Recall 3 3 3   Language- name 2 objects 2 2 2   Language- repeat 1 1 1   Language- follow 3 step command 3 3 3   Language- read & follow direction 1 1 1   Write a sentence 1 1 1   Copy design 1 1 1   Total score 30 30 29         Immunization History  Administered Date(s) Administered  . Influenza, High Dose Seasonal PF 04/08/2016, 03/19/2017, 04/16/2018  . Influenza,inj,Quad PF,6+ Mos 03/01/2015  . Pneumococcal Conjugate-13 04/08/2016  . Pneumococcal Polysaccharide-23 05/12/2017  . Tdap 11/07/2014    Qualifies for Shingles Vaccine? Yes, declined today  Screening Tests Health Maintenance  Topic Date Due  . INFLUENZA VACCINE  10/23/2017  . TETANUS/TDAP  11/06/2024  . COLONOSCOPY  08/12/2027  . Hepatitis C Screening  Completed  . PNA vac Low Risk Adult  Completed   Cancer Screenings: Lung: Low Dose CT Chest recommended if Age 76-80 years, 30 pack-year  currently smoking OR have quit w/in 15years. Patient does not qualify. Colorectal: up to date  Additional Screenings: Hepatitis C Screening:  Completed 11/07/2014      Plan:  Please work on your goal of exercising 150 minutes per week. Follow up with Dr. Louanne Skyeettinger as scheduled.  Consider getting the Shingrix (Shingles vaccine) in the future.   I have personally reviewed and noted the following in the patient's chart:   . Medical and social history . Use of alcohol, tobacco or illicit drugs  . Current medications and supplements . Functional ability and status . Nutritional status . Physical activity . Advanced directives . List of other physicians . Hospitalizations, surgeries, and ER visits in previous 12 months . Vitals . Screenings to include cognitive, depression, and falls . Referrals and appointments  In addition, I have reviewed and discussed with patient certain preventive protocols, quality metrics, and best practice recommendations. A written personalized care plan for preventive services as well as general preventive health recommendations were provided to patient.     Bernadene BellWYATT, AMY M, RN  04/16/2018

## 2018-04-16 NOTE — Patient Instructions (Addendum)
Please work on your goal:   Goals    . Exercise 150 min/wk Moderate Activity     Using your total gym is a great option.     Please follow up with Dr. Warrick Parisian as scheduled.   Please consider getting the Shingrix (Shingles vaccine) in the future.   Thank you for coming for your Annual Wellness Visit today!!  Preventive Care 67 Years and Older, Male Preventive care refers to lifestyle choices and visits with your health care provider that can promote health and wellness. What does preventive care include?   A yearly physical exam. This is also called an annual well check.  Dental exams once or twice a year.  Routine eye exams. Ask your health care provider how often you should have your eyes checked.  Personal lifestyle choices, including: ? Daily care of your teeth and gums. ? Regular physical activity. ? Eating a healthy diet. ? Avoiding tobacco and drug use. ? Limiting alcohol use. ? Practicing safe sex. ? Taking low doses of aspirin every day. ? Taking vitamin and mineral supplements as recommended by your health care provider. What happens during an annual well check? The services and screenings done by your health care provider during your annual well check will depend on your age, overall health, lifestyle risk factors, and family history of disease. Counseling Your health care provider may ask you questions about your:  Alcohol use.  Tobacco use.  Drug use.  Emotional well-being.  Home and relationship well-being.  Sexual activity.  Eating habits.  History of falls.  Memory and ability to understand (cognition).  Work and work Statistician. Screening You may have the following tests or measurements:  Height, weight, and BMI.  Blood pressure.  Lipid and cholesterol levels. These may be checked every 5 years, or more frequently if you are over 59 years old.  Skin check.  Lung cancer screening. You may have this screening every year starting at  age 78 if you have a 30-pack-year history of smoking and currently smoke or have quit within the past 15 years.  Colorectal cancer screening. All adults should have this screening starting at age 67 and continuing until age 30. You will have tests every 1-10 years, depending on your results and the type of screening test. People at increased risk should start screening at an earlier age. Screening tests may include: ? Guaiac-based fecal occult blood testing. ? Fecal immunochemical test (FIT). ? Stool DNA test. ? Virtual colonoscopy. ? Sigmoidoscopy. During this test, a flexible tube with a tiny camera (sigmoidoscope) is used to examine your rectum and lower colon. The sigmoidoscope is inserted through your anus into your rectum and lower colon. ? Colonoscopy. During this test, a long, thin, flexible tube with a tiny camera (colonoscope) is used to examine your entire colon and rectum.  Prostate cancer screening. Recommendations will vary depending on your family history and other risks.  Hepatitis C blood test.  Hepatitis B blood test.  Sexually transmitted disease (STD) testing.  Diabetes screening. This is done by checking your blood sugar (glucose) after you have not eaten for a while (fasting). You may have this done every 1-3 years.  Abdominal aortic aneurysm (AAA) screening. You may need this if you are a current or former smoker.  Osteoporosis. You may be screened starting at age 45 if you are at high risk. Talk with your health care provider about your test results, treatment options, and if necessary, the need for more tests. Vaccines  Your health care provider may recommend certain vaccines, such as:  Influenza vaccine. This is recommended every year.  Tetanus, diphtheria, and acellular pertussis (Tdap, Td) vaccine. You may need a Td booster every 10 years.  Varicella vaccine. You may need this if you have not been vaccinated.  Zoster vaccine. You may need this after age  67.  Measles, mumps, and rubella (MMR) vaccine. You may need at least one dose of MMR if you were born in 1957 or later. You may also need a second dose.  Pneumococcal 13-valent conjugate (PCV13) vaccine. One dose is recommended after age 67.  Pneumococcal polysaccharide (PPSV23) vaccine. One dose is recommended after age 67.  Meningococcal vaccine. You may need this if you have certain conditions.  Hepatitis A vaccine. You may need this if you have certain conditions or if you travel or work in places where you may be exposed to hepatitis A.  Hepatitis B vaccine. You may need this if you have certain conditions or if you travel or work in places where you may be exposed to hepatitis B.  Haemophilus influenzae type b (Hib) vaccine. You may need this if you have certain risk factors. Talk to your health care provider about which screenings and vaccines you need and how often you need them. This information is not intended to replace advice given to you by your health care provider. Make sure you discuss any questions you have with your health care provider. Document Released: 04/07/2015 Document Revised: 05/01/2017 Document Reviewed: 01/10/2015 Elsevier Interactive Patient Education  2019 [employer].

## 2018-04-30 ENCOUNTER — Other Ambulatory Visit: Payer: Self-pay

## 2018-04-30 DIAGNOSIS — Z7189 Other specified counseling: Secondary | ICD-10-CM

## 2018-04-30 NOTE — Progress Notes (Signed)
Referral placed for ENT for hearing aid's.

## 2018-05-14 ENCOUNTER — Ambulatory Visit (INDEPENDENT_AMBULATORY_CARE_PROVIDER_SITE_OTHER): Payer: Medicare Other | Admitting: Family Medicine

## 2018-05-14 ENCOUNTER — Encounter: Payer: Self-pay | Admitting: Family Medicine

## 2018-05-14 VITALS — BP 137/88 | HR 58 | Temp 98.2°F | Ht 68.0 in | Wt 222.0 lb

## 2018-05-14 DIAGNOSIS — N529 Male erectile dysfunction, unspecified: Secondary | ICD-10-CM | POA: Diagnosis not present

## 2018-05-14 DIAGNOSIS — Z0001 Encounter for general adult medical examination with abnormal findings: Secondary | ICD-10-CM

## 2018-05-14 DIAGNOSIS — I10 Essential (primary) hypertension: Secondary | ICD-10-CM

## 2018-05-14 DIAGNOSIS — E782 Mixed hyperlipidemia: Secondary | ICD-10-CM | POA: Diagnosis not present

## 2018-05-14 DIAGNOSIS — E785 Hyperlipidemia, unspecified: Secondary | ICD-10-CM | POA: Diagnosis not present

## 2018-05-14 DIAGNOSIS — Z125 Encounter for screening for malignant neoplasm of prostate: Secondary | ICD-10-CM

## 2018-05-14 DIAGNOSIS — Z0000 Encounter for general adult medical examination without abnormal findings: Secondary | ICD-10-CM

## 2018-05-14 MED ORDER — AMLODIPINE BESYLATE 10 MG PO TABS: 10.0000 mg | ORAL_TABLET | Freq: Every day | ORAL | 3 refills | Status: DC

## 2018-05-14 MED ORDER — ATORVASTATIN CALCIUM 10 MG PO TABS: 10.0000 mg | ORAL_TABLET | Freq: Every day | ORAL | 3 refills | Status: DC

## 2018-05-14 MED ORDER — SILDENAFIL CITRATE 20 MG PO TABS: ORAL_TABLET | ORAL | 3 refills | Status: DC

## 2018-05-14 NOTE — Progress Notes (Signed)
BP 137/88   Pulse (!) 58   Temp 98.2 F (36.8 C) (Oral)   Ht '5\' 8"'  (1.727 m)   Wt 222 lb (100.7 kg)   BMI 33.75 kg/m    Subjective:    Patient ID: John Mejia, male    DOB: 06-Jul-1950, 68 y.o.   MRN: [id number]  HPI: John Mejia is a 68 y.o. male presenting on 05/14/2018 for Annual Exam   HPI Well adult exam and physical Patient is coming in for well adult exam and physical and hypertension cholesterol recheck.  He wants refills of medication for erectile dysfunction and hypertension cholesterol. Patient denies any chest pain, shortness of breath, headaches or vision issues, abdominal complaints, diarrhea, nausea, vomiting, or joint issues.   Relevant past medical, surgical, family and social history reviewed and updated as indicated. Interim medical history since our last visit reviewed. Allergies and medications reviewed and updated.  Review of Systems  Constitutional: Negative for chills and fever.  HENT: Negative for ear pain and tinnitus.   Eyes: Negative for pain.  Respiratory: Negative for cough, shortness of breath and wheezing.   Cardiovascular: Negative for chest pain, palpitations and leg swelling.  Gastrointestinal: Negative for abdominal pain, blood in stool, constipation and diarrhea.  Genitourinary: Negative for dysuria and hematuria.  Musculoskeletal: Negative for back pain and myalgias.  Skin: Negative for rash.  Neurological: Negative for dizziness, weakness and headaches.  Psychiatric/Behavioral: Negative for suicidal ideas.    Per HPI unless specifically indicated above   Allergies as of 05/14/2018   No Known Allergies     Medication List       Accurate as of May 14, 2018  3:16 PM. Always use your most recent med list.        amLODipine 10 MG tablet Commonly known as:  NORVASC Take 1 tablet (10 mg total) by mouth daily.   atorvastatin 10 MG tablet Commonly known as:  LIPITOR Take 1 tablet (10 mg total) by mouth daily.     diclofenac sodium 1 % Gel Commonly known as:  VOLTAREN Apply 2 g topically 4 (four) times daily.   sildenafil 20 MG tablet Commonly known as:  REVATIO TAKE 2-5 TABLETS BY MOUTH AS NEEDED FOR SEXUAL ACTIVITY          Objective:    BP 137/88   Pulse (!) 58   Temp 98.2 F (36.8 C) (Oral)   Ht '5\' 8"'  (1.727 m)   Wt 222 lb (100.7 kg)   BMI 33.75 kg/m   Wt Readings from Last 3 Encounters:  05/14/18 222 lb (100.7 kg)  04/16/18 225 lb (102.1 kg)  11/20/17 222 lb 9.6 oz (101 kg)    Physical Exam Vitals signs reviewed.  Constitutional:      General: He is not in acute distress.    Appearance: He is well-developed. He is not diaphoretic.  HENT:     Right Ear: External ear normal.     Left Ear: External ear normal.     Nose: Nose normal.     Mouth/Throat:     Pharynx: No oropharyngeal exudate.  Eyes:     General: No scleral icterus.       Right eye: No discharge.     Conjunctiva/sclera: Conjunctivae normal.     Pupils: Pupils are equal, round, and reactive to light.  Neck:     Musculoskeletal: Neck supple.     Thyroid: No thyromegaly.  Cardiovascular:     Rate and Rhythm: Normal  rate and regular rhythm.     Heart sounds: Normal heart sounds. No murmur.  Pulmonary:     Effort: Pulmonary effort is normal. No respiratory distress.     Breath sounds: Normal breath sounds. No wheezing.  Abdominal:     General: Bowel sounds are normal. There is no distension.     Palpations: Abdomen is soft.     Tenderness: There is no abdominal tenderness. There is no guarding or rebound.  Musculoskeletal: Normal range of motion.  Lymphadenopathy:     Cervical: No cervical adenopathy.  Skin:    General: Skin is warm and dry.     Findings: No rash.  Neurological:     Mental Status: He is alert and oriented to person, place, and time.     Coordination: Coordination normal.  Psychiatric:        Behavior: Behavior normal.         Assessment & Plan:   Problem List Items Addressed  This Visit      Cardiovascular and Mediastinum   Hypertension   Relevant Medications   amLODipine (NORVASC) 10 MG tablet   atorvastatin (LIPITOR) 10 MG tablet   sildenafil (REVATIO) 20 MG tablet   Other Relevant Orders   CMP14+EGFR     Other   Hyperlipidemia   Relevant Medications   amLODipine (NORVASC) 10 MG tablet   atorvastatin (LIPITOR) 10 MG tablet   sildenafil (REVATIO) 20 MG tablet   Other Relevant Orders   Lipid panel   Erectile dysfunction   Relevant Medications   sildenafil (REVATIO) 20 MG tablet   Other Relevant Orders   CBC with Differential/Platelet    Other Visit Diagnoses    Well adult exam    -  Primary   Relevant Orders   CBC with Differential/Platelet   CMP14+EGFR   Lipid panel      Problem List Items Addressed This Visit      Cardiovascular and Mediastinum   Hypertension   Relevant Medications   amLODipine (NORVASC) 10 MG tablet   atorvastatin (LIPITOR) 10 MG tablet   sildenafil (REVATIO) 20 MG tablet   Other Relevant Orders   CMP14+EGFR     Other   Hyperlipidemia   Relevant Medications   amLODipine (NORVASC) 10 MG tablet   atorvastatin (LIPITOR) 10 MG tablet   sildenafil (REVATIO) 20 MG tablet   Other Relevant Orders   Lipid panel   Erectile dysfunction   Relevant Medications   sildenafil (REVATIO) 20 MG tablet   Other Relevant Orders   CBC with Differential/Platelet    Other Visit Diagnoses    Well adult exam    -  Primary   Relevant Orders   CBC with Differential/Platelet   CMP14+EGFR   Lipid panel       Follow up plan: Return in about 1 year (around 05/15/2019), or if symptoms worsen or fail to improve.  Counseling provided for all of the vaccine components Orders Placed This Encounter  Procedures  . CBC with Differential/Platelet  . CMP14+EGFR  . Lipid panel    Caryl Pina, MD [facility] 05/14/2018, 3:16 PM

## 2018-05-15 LAB — CMP14+EGFR
ALT: 19 IU/L (ref 0–44)
AST: 21 IU/L (ref 0–40)
Albumin/Globulin Ratio: 2 (ref 1.2–2.2)
Albumin: 4.8 g/dL (ref 3.8–4.8)
Alkaline Phosphatase: 89 IU/L (ref 39–117)
BUN/Creatinine Ratio: 13 (ref 10–24)
BUN: 12 mg/dL (ref 8–27)
Bilirubin Total: 0.6 mg/dL (ref 0.0–1.2)
CO2: 24 mmol/L (ref 20–29)
Calcium: 11.2 mg/dL — ABNORMAL HIGH (ref 8.6–10.2)
Chloride: 104 mmol/L (ref 96–106)
Creatinine, Ser: 0.9 mg/dL (ref 0.76–1.27)
GFR calc Af Amer: 102 mL/min/{1.73_m2} (ref >59)
GFR, EST NON AFRICAN AMERICAN: 88 mL/min/{1.73_m2} (ref >59)
Globulin, Total: 2.4 g/dL (ref 1.5–4.5)
Glucose: 83 mg/dL (ref 65–99)
POTASSIUM: 4.6 mmol/L (ref 3.5–5.2)
Sodium: 140 mmol/L (ref 134–144)
Total Protein: 7.2 g/dL (ref 6.0–8.5)

## 2018-05-15 LAB — CBC WITH DIFFERENTIAL/PLATELET
BASOS: 1 %
Basophils Absolute: 0 10*3/uL (ref 0.0–0.2)
EOS (ABSOLUTE): 0.1 10*3/uL (ref 0.0–0.4)
Eos: 2 %
Hematocrit: 38.2 % (ref 37.5–51.0)
Hemoglobin: 13.8 g/dL (ref 13.0–17.7)
IMMATURE GRANS (ABS): 0 10*3/uL (ref 0.0–0.1)
IMMATURE GRANULOCYTES: 0 %
Lymphocytes Absolute: 1.4 10*3/uL (ref 0.7–3.1)
Lymphs: 36 %
MCH: 28.1 pg (ref 26.6–33.0)
MCHC: 36.1 g/dL — ABNORMAL HIGH (ref 31.5–35.7)
MCV: 78 fL — ABNORMAL LOW (ref 79–97)
Monocytes Absolute: 0.4 10*3/uL (ref 0.1–0.9)
Monocytes: 10 %
NEUTROS PCT: 51 %
Neutrophils Absolute: 2 10*3/uL (ref 1.4–7.0)
Platelets: 228 10*3/uL (ref 150–450)
RBC: 4.91 x10E6/uL (ref 4.14–5.80)
RDW: 13.3 % (ref 11.6–15.4)
WBC: 3.9 10*3/uL (ref 3.4–10.8)

## 2018-05-15 LAB — PSA, TOTAL AND FREE
PSA, Free Pct: 32.9 %
PSA, Free: 0.23 ng/mL
Prostate Specific Ag, Serum: 0.7 ng/mL (ref 0.0–4.0)

## 2018-05-15 LAB — LIPID PANEL
Chol/HDL Ratio: 2.2 ratio (ref 0.0–5.0)
Cholesterol, Total: 114 mg/dL (ref 100–199)
HDL: 53 mg/dL (ref >39)
LDL Calculated: 53 mg/dL (ref 0–99)
Triglycerides: 40 mg/dL (ref 0–149)
VLDL Cholesterol Cal: 8 mg/dL (ref 5–40)

## 2018-06-02 ENCOUNTER — Other Ambulatory Visit: Payer: Self-pay | Admitting: Family

## 2018-06-02 DIAGNOSIS — E782 Mixed hyperlipidemia: Secondary | ICD-10-CM

## 2018-08-12 IMAGING — CT CT RENAL STONE PROTOCOL
2 of 4 series · 16 of 46 positions shown, 18 images · non-contrast
Comparison: 12/26/2008

CLINICAL DATA: Intermittent left flank pain for 1 month

EXAM:
CT ABDOMEN AND PELVIS WITHOUT CONTRAST
TECHNIQUE: Multidetector CT imaging of the abdomen and pelvis was performed
following the standard protocol without IV contrast.

[Series 2: axial st · axial · 0.89mm/px · z∈[-467,-7]mm · 13 of 102 slices shown, 15 images]
[im 5/102  soft-tissue]
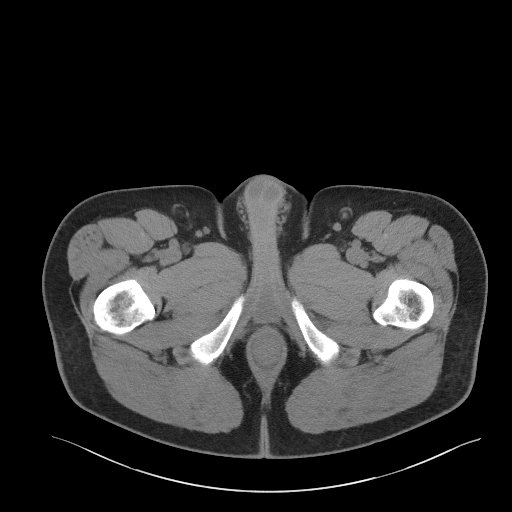
[im 5/102  bone]
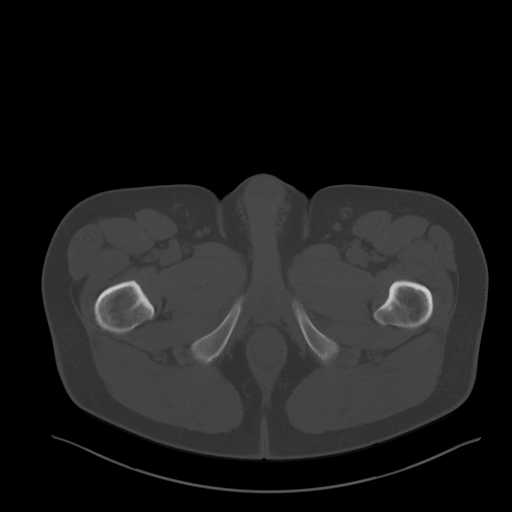
[im 13/102  soft-tissue]
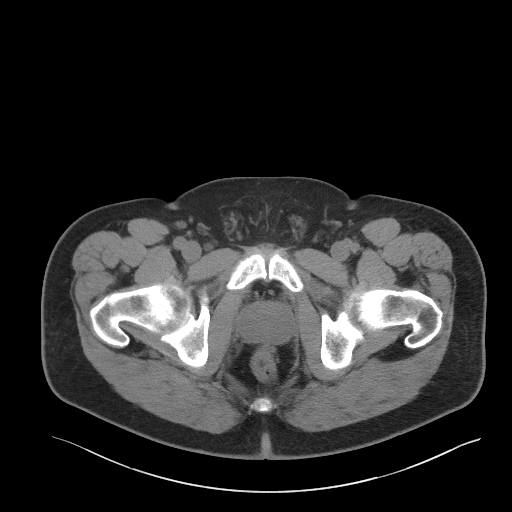
[im 22/102  soft-tissue]
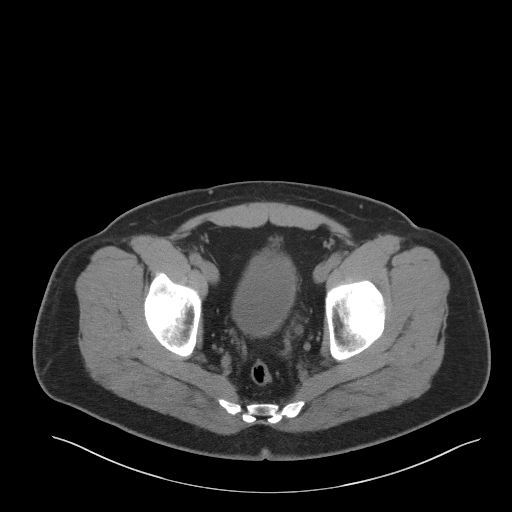
[im 30/102  soft-tissue]
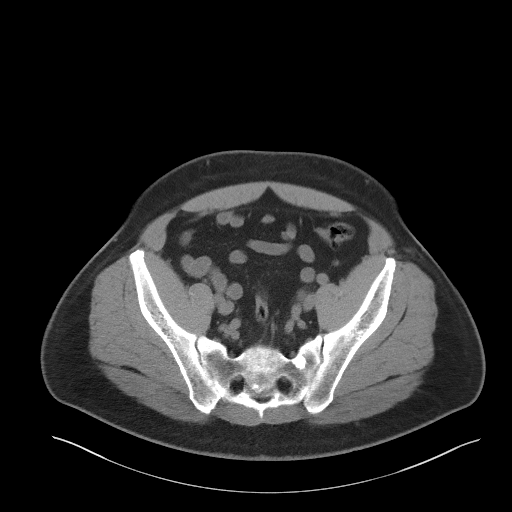
[im 34/102  soft-tissue]
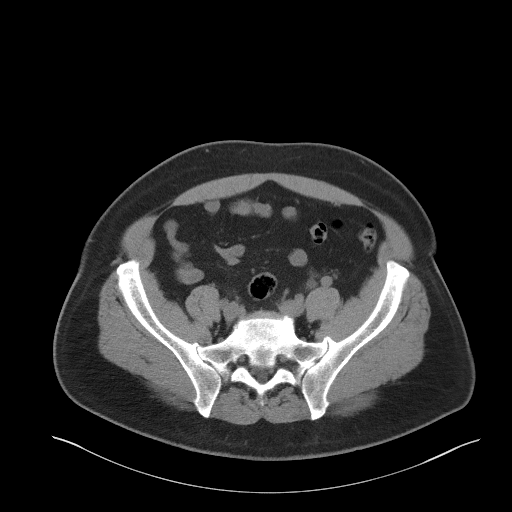
[im 43/102  soft-tissue]
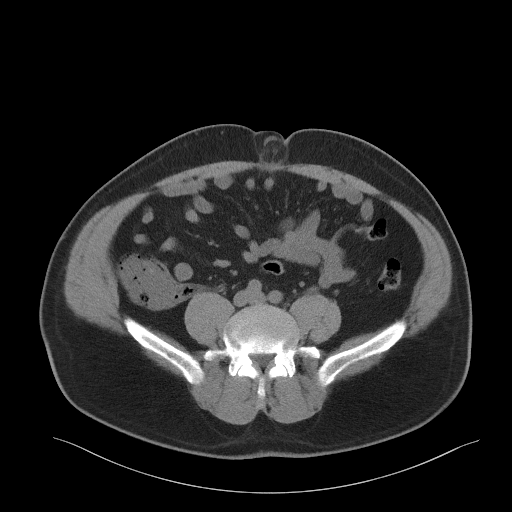
[im 51/102  soft-tissue]
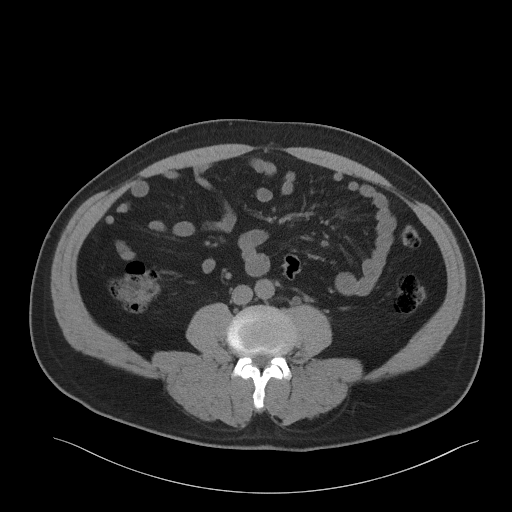
[im 59/102  soft-tissue]
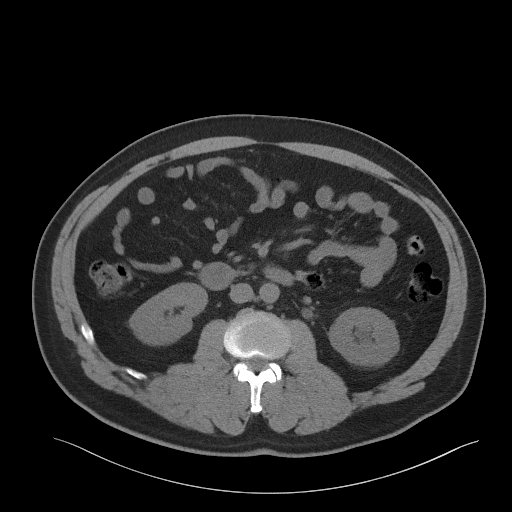
[im 68/102  soft-tissue]
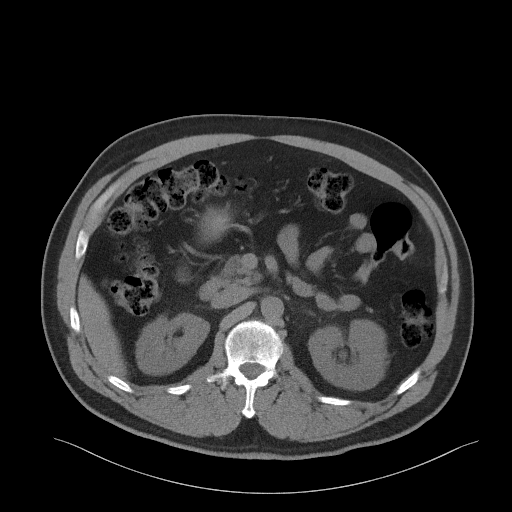
[im 68/102  bone]
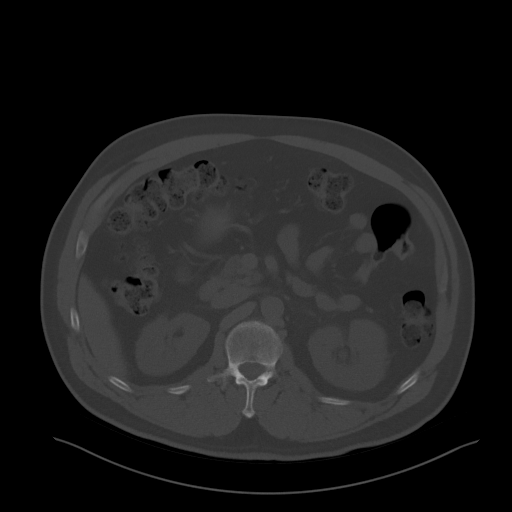
[im 72/102  soft-tissue]
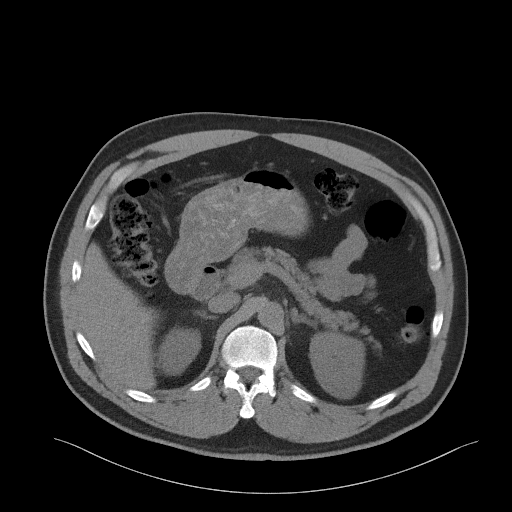
[im 80/102  soft-tissue]
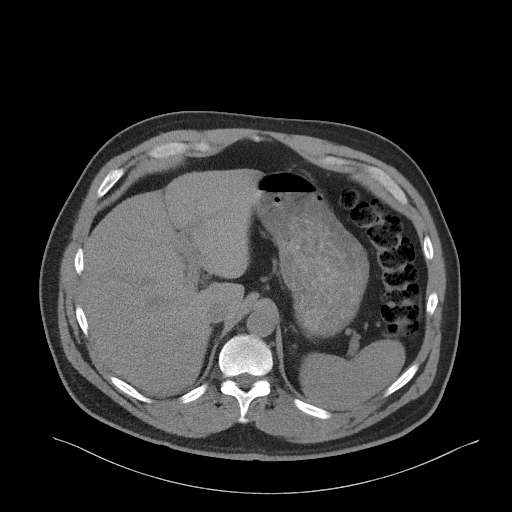
[im 89/102  soft-tissue]
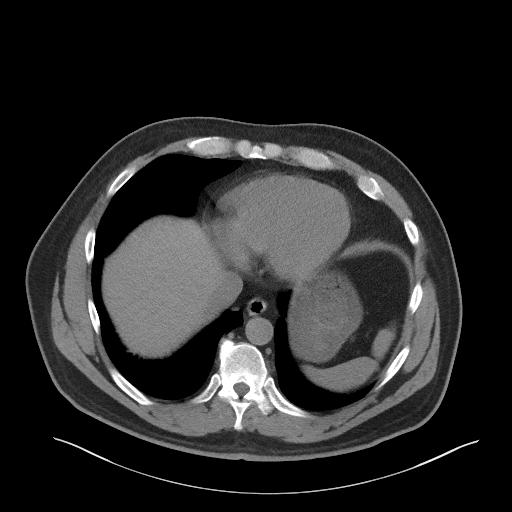
[im 97/102  soft-tissue]
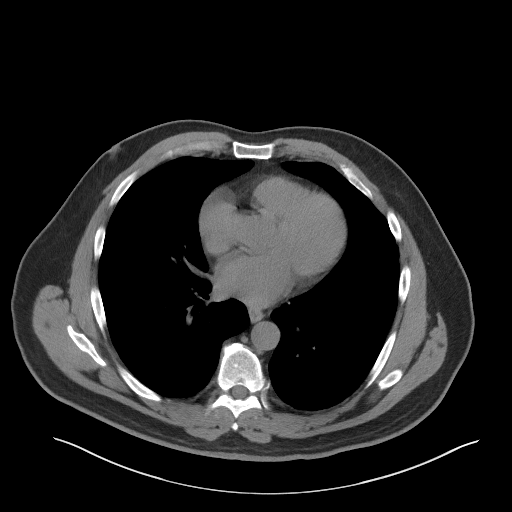

[Series 5: coronal st · coronal · 0.85mm/px · 3 of 102 slices shown]
[im 34/102  soft-tissue]
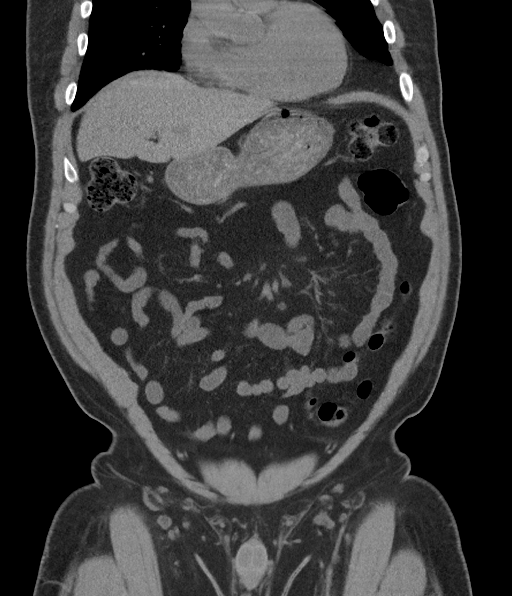
[im 45/102  soft-tissue]
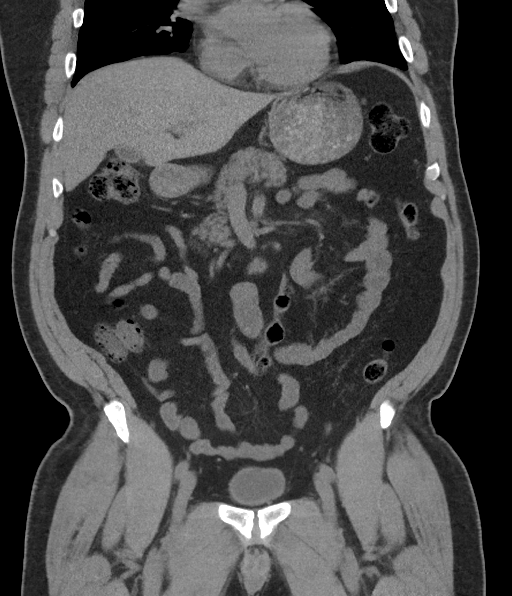
[im 57/102  soft-tissue]
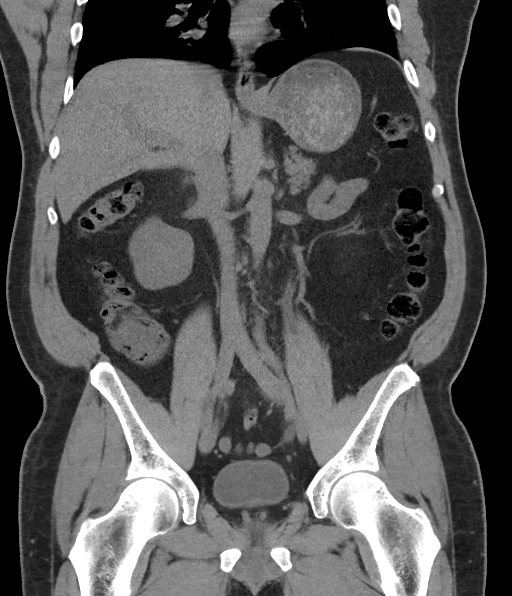

[16 of 46 positions shown; findings below may reference images not displayed]

FINDINGS: Lower chest: Lung bases demonstrate mild atelectasis at the lingula.
Normal heart size.

Hepatobiliary: No focal liver abnormality is seen. No gallstones,
gallbladder wall thickening, or biliary dilatation.

Pancreas: Unremarkable. No pancreatic ductal dilatation or
surrounding inflammatory changes.

Spleen: Normal in size without focal abnormality.

Adrenals/Urinary Tract: Adrenal glands are within normal limits. No
hydronephrosis on the right. Mild to moderate hydronephrosis and
hydroureter on the left, secondary to a 6 mm oblique AP, by 8 mm
cranial caudad stone in the distal left ureter, just above the left
UVJ. Bladder otherwise normal.

Stomach/Bowel: Stomach is nonenlarged. No dilated small bowel. Mass
like filling defect in the cecum. Normal appendix. No colon wall
thickening. Sigmoid colon diverticula without acute inflammation.

Vascular/Lymphatic: No significant vascular findings are present. No
enlarged abdominal or pelvic lymph nodes.

Reproductive: Slightly enlarged prostate

Other: No free air or free fluid.  Fat in the umbilicus.

Musculoskeletal: Degenerative changes of the spine. No acute or
suspicious bone lesion.
IMPRESSION: 1. Mild to moderate left hydronephrosis, secondary to a 6 x 8 mm
stone in the distal left ureter, just above the left UVJ.
2. Hypodense filling defect within the cecum, could reflect formed
stool although intraluminal mass difficult to exclude without
contrast. Options for management include follow-up non emergent CT
with enteral and intravenous contrast. Alternatively colonoscopy
could be considered.
3. Sigmoid colon diverticular disease without acute inflammation

These results will be called to the ordering clinician or
representative by the Radiologist Assistant, and communication
documented in the PACS or zVision Dashboard.

## 2019-02-11 DIAGNOSIS — H538 Other visual disturbances: Secondary | ICD-10-CM | POA: Diagnosis not present

## 2019-02-11 DIAGNOSIS — H524 Presbyopia: Secondary | ICD-10-CM | POA: Diagnosis not present

## 2019-05-20 ENCOUNTER — Ambulatory Visit: Payer: Medicare Other | Attending: Internal Medicine

## 2019-05-20 DIAGNOSIS — Z23 Encounter for immunization: Secondary | ICD-10-CM

## 2019-05-20 NOTE — Progress Notes (Signed)
   Covid-19 Vaccination Clinic  Name:  John Mejia    MRN: [id number] DOB: 07/13/1950  05/20/2019  John Mejia was observed post Covid-19 immunization for 15 minutes without incidence. He was provided with Vaccine Information Sheet and instruction to access the V-Safe system.   John Mejia was instructed to call 911 with any severe reactions post vaccine: Marland Kitchen Difficulty breathing  . Swelling of your face and throat  . A fast heartbeat  . A bad rash all over your body  . Dizziness and weakness    Immunizations Administered    Name Date Dose VIS Date Route   Pfizer COVID-19 Vaccine 05/20/2019 11:46 AM 0.3 mL 03/05/2019 Intramuscular   Manufacturer: [employer], [employer]   Lot: [id number]   NDC: [address]-2

## 2019-06-15 ENCOUNTER — Ambulatory Visit: Payer: Medicare Other | Attending: Internal Medicine

## 2019-06-15 DIAGNOSIS — Z23 Encounter for immunization: Secondary | ICD-10-CM

## 2019-06-15 NOTE — Progress Notes (Signed)
   Covid-19 Vaccination Clinic  Name:  John Mejia    MRN: [id number] DOB: May 26, 1950  06/15/2019  John Mejia was observed post Covid-19 immunization for 15 minutes without incident. He was provided with Vaccine Information Sheet and instruction to access the V-Safe system.   John Mejia was instructed to call 911 with any severe reactions post vaccine: Marland Kitchen Difficulty breathing  . Swelling of face and throat  . A fast heartbeat  . A bad rash all over body  . Dizziness and weakness   Immunizations Administered    Name Date Dose VIS Date Route   Pfizer COVID-19 Vaccine 06/15/2019  9:01 AM 0.3 mL 03/05/2019 Intramuscular   Manufacturer: [employer], [employer]   Lot: [id number]   NDC: [address]-9

## 2019-06-30 ENCOUNTER — Ambulatory Visit (INDEPENDENT_AMBULATORY_CARE_PROVIDER_SITE_OTHER): Payer: Medicare Other

## 2019-06-30 VITALS — BP 141/81

## 2019-06-30 DIAGNOSIS — Z0000 Encounter for general adult medical examination without abnormal findings: Secondary | ICD-10-CM

## 2019-06-30 NOTE — Progress Notes (Addendum)
MEDICARE ANNUAL WELLNESS VISIT  06/30/2019  Telephone Visit Disclaimer This Medicare AWV was conducted by telephone due to national recommendations for restrictions regarding the COVID-19 Pandemic (e.g. social distancing).  I verified, using two identifiers, that I am speaking with [place] Mejia or their authorized healthcare agent. I discussed the limitations, risks, security, and privacy concerns of performing an evaluation and management service by telephone and the potential availability of an in-person appointment in the future. The patient expressed understanding and agreed to proceed.   Subjective:  John Mejia is a 69 y.o. male patient of Dettinger, Fransisca Kaufmann, MD who had a Medicare Annual Wellness Visit today via telephone. John Mejia is Retired and lives with their spouse. he has no children. he reports that he is socially active and does interact with friends/family regularly. he is minimally physically active and enjoys yard work, Office manager, and farming.  Patient Care Team: Dettinger, Fransisca Kaufmann, MD as PCP - General (Family Medicine) Minus Breeding, MD as Consulting Physician (Cardiology)  Advanced Directives 06/30/2019 04/16/2018 04/09/2017  Does Patient Have a Medical Advance Directive? Yes Yes Yes  Type of Paramedic of [place];Living will [place]  Does patient want to make changes to medical advance directive? No - Patient declined - -  Copy of [place] in Chart? - Yes - validated most recent copy scanned in chart (See row information) Yes    Mejia Utilization Over the Past 12 Months: # of hospitalizations or ER visits: 0 # of surgeries: 0  Review of Systems    Patient reports that his overall health is unchanged compared to last year.   Patient Reported Readings (BP- 141/81  Pain Assessment Pain : No/denies pain     Current Medications & Allergies (verified) Allergies  as of 06/30/2019   No Known Allergies      Medication List        Accurate as of June 30, 2019 10:42 AM. If you have any questions, ask your nurse or doctor.          STOP taking these medications    diclofenac sodium 1 % Gel Commonly known as: Voltaren       TAKE these medications    amLODipine 10 MG tablet Commonly known as: NORVASC Take 1 tablet (10 mg total) by mouth daily.   atorvastatin 10 MG tablet Commonly known as: LIPITOR Take 1 tablet (10 mg total) by mouth daily.   co-enzyme Q-10 30 MG capsule Take 30 mg by mouth daily.   sildenafil 20 MG tablet Commonly known as: REVATIO TAKE 2-5 TABLETS BY MOUTH AS NEEDED FOR SEXUAL ACTIVITY        History (reviewed): Past Medical History:  Diagnosis Date   Asthma    Cataract    Eczema    Erectile dysfunction    Hyperlipidemia    Hypertension    Multiple allergies    Right-sided carotid artery disease (HCC)    Past Surgical History:  Procedure Laterality Date   COLONOSCOPY     2009   VASECTOMY     Family History  Problem Relation Age of Onset   Hypertension Father    Stroke Father 6   Heart disease Father        "Enlarged heart"   Hypertension Mother    Cancer Sister        ovarian cancer / fibroid tumors   Social History   Socioeconomic History  Marital status: Married    Spouse name: Not on file   Number of children: 0   Years of education: some trade school   Highest education level: Some college, no degree  Occupational History   Occupation: farmer    Comment: Currently farming part time   Occupation: Chartered certified accountant    Comment: retired  Tobacco Use   Smoking status: Former Smoker    Packs/day: 0.10    Years: 4.00    Pack years: 0.40    Types: Cigarettes    Quit date: 03/25/1978    Years since quitting: 41.2   Smokeless tobacco: Never Used   Tobacco comment: Only smoked socially   Substance and Sexual Activity   Alcohol use: Not Currently   Drug use: No   Sexual activity: Yes    Other Topics Concern   Not on file  Social History Narrative   Patient is retired and lives at home with his wife. They do not have any children.    Social Determinants of Health   Financial Resource Strain:    Difficulty of Paying Living Expenses:   Food Insecurity:    Worried About Programme researcher, broadcasting/film/video in the Last Year:    Barista in the Last Year:   Transportation Needs:    Freight forwarder (Medical):    Lack of Transportation (Non-Medical):   Physical Activity:    Days of Exercise per Week:    Minutes of Exercise per Session:   Stress:    Feeling of Stress :   Social Connections:    Frequency of Communication with Friends and Family:    Frequency of Social Gatherings with Friends and Family:    Attends Religious Services:    Active Member of Clubs or Organizations:    Attends Banker Meetings:    Marital Status:     Activities of Daily Living In your present state of health, do you have any difficulty performing the following activities: 06/30/2019  Hearing? N  Vision? N  Difficulty concentrating or making decisions? N  Walking or climbing stairs? N  Dressing or bathing? N  Doing errands, shopping? N  Preparing Food and eating ? N  Using the Toilet? N  In the past six months, have you accidently leaked urine? N  Do you have problems with loss of bowel control? N  Managing your Medications? N  Managing your Finances? N  Housekeeping or managing your Housekeeping? N  Some recent data might be hidden    Patient Education/ Literacy How often do you need to have someone help you when you read instructions, pamphlets, or other written materials from your doctor or pharmacy?: 1 - Never What is the last grade level you completed in school?: Some College/ Military  Exercise Current Exercise Habits: Home exercise routine, Type of exercise: walking, Time (Minutes): 30, Frequency (Times/Week): 3, Weekly Exercise (Minutes/Week): 90, Intensity:  Mild  Diet Patient reports consuming 2 meals a day and 2 snack(s) a day Patient reports that his primary diet is: Regular Patient reports that she does have regular access to food.   Depression Screen PHQ 2/9 Scores 06/30/2019 05/14/2018 04/16/2018 11/20/2017 05/12/2017 04/09/2017 08/16/2016  PHQ - 2 Score 0 0 0 0 0 0 0  PHQ- 9 Score - 0 - - - - -     Fall Risk Fall Risk  06/30/2019 05/14/2018 04/16/2018 11/20/2017 05/12/2017  Falls in the past year? 0 0 0 No No  Comment - - - - -  Number falls in past yr: - - - - -  Injury with Fall? - - - - -     Objective:  John Mejia seemed alert and oriented and he participated appropriately during our telephone visit.  Blood Pressure Weight BMI  BP Readings from Last 3 Encounters:  06/30/19 (!) 141/81  05/14/18 137/88  04/16/18 137/85   Wt Readings from Last 3 Encounters:  05/14/18 222 lb (100.7 kg)  04/16/18 225 lb (102.1 kg)  11/20/17 222 lb 9.6 oz (101 kg)   BMI Readings from Last 1 Encounters:  05/14/18 33.75 kg/m    *Unable to obtain current vital signs, weight, and BMI due to telephone visit type  Hearing/Vision  John Mejia did not seem to have difficulty with hearing/understanding during the telephone conversation Reports that he has had a formal eye exam by an eye care professional within the past year Reports that he has not had a formal hearing evaluation within the past year *Unable to fully assess hearing and vision during telephone visit type  Cognitive Function: 6CIT Screen 06/30/2019  What Year? 0 points  What month? 0 points  What time? 0 points  Count back from 20 0 points  Months in reverse 0 points  Repeat phrase 0 points  Total Score 0   (Normal:0-7, Significant for Dysfunction: >8)  Normal Cognitive Function Screening: Yes   Immunization & Health Maintenance Record Immunization History  Administered Date(s) Administered   Influenza, High Dose Seasonal PF 04/08/2016, 03/19/2017, 04/16/2018   Influenza,inj,Quad  PF,6+ Mos 03/01/2015   PFIZER SARS-COV-2 Vaccination 05/20/2019, 06/15/2019   Pneumococcal Conjugate-13 04/08/2016   Pneumococcal Polysaccharide-23 05/12/2017   Tdap 11/07/2014    Health Maintenance  Topic Date Due   INFLUENZA VACCINE  10/24/2019   TETANUS/TDAP  11/06/2024   COLONOSCOPY  08/12/2027   Hepatitis C Screening  Completed   PNA vac Low Risk Adult  Completed       Assessment  This is a routine wellness examination for John Mejia.  Health Maintenance: Due or Overdue There are no preventive care reminders to display for this patient.  [facility] Mejia does not need a referral for [employer] Assistance: Care Management:   no Social Work:    no Prescription Assistance:  no Nutrition/Diabetes Education:  no   Plan:  Personalized Goals Goals Addressed             This Visit's Progress    Blood Pressure < 140/90   141/81      Personalized Health Maintenance & Screening Recommendations  Lung Cancer Screening Recommended: no (Low Dose CT Chest recommended if Age 30-80 years, 30 pack-year currently smoking OR have quit w/in past 15 years) Hepatitis C Screening recommended: no HIV Screening recommended: no  Advanced Directives: Written information was not prepared per patient's request.  Referrals & Orders No orders of the defined types were placed in this encounter.   Follow-up Plan Follow-up with Dettinger, Elige Radon, MD as planned Schedule 07/28/2019    I have personally reviewed and noted the following in the patient's chart:   Medical and social history Use of alcohol, tobacco or illicit drugs  Current medications and supplements Functional ability and status Nutritional status Physical activity Advanced directives List of other physicians Hospitalizations, surgeries, and ER visits in previous 12 months Vitals Screenings to include cognitive, depression, and falls Referrals and appointments  In addition, I have reviewed and discussed with  John Mejia certain preventive protocols, quality metrics, and best practice recommendations.  A written personalized care plan for preventive services as well as general preventive health recommendations is available and can be mailed to the patient at his request.      Suzan [facility]  12/02/1442    I have reviewed and agree with the above AWV documentation.   John Rodney, FNP

## 2019-07-28 ENCOUNTER — Encounter: Payer: Self-pay | Admitting: Family Medicine

## 2019-07-28 ENCOUNTER — Ambulatory Visit (INDEPENDENT_AMBULATORY_CARE_PROVIDER_SITE_OTHER): Payer: Medicare Other | Admitting: Family Medicine

## 2019-07-28 ENCOUNTER — Other Ambulatory Visit: Payer: Self-pay

## 2019-07-28 VITALS — BP 126/72 | HR 66 | Temp 97.0°F | Ht 68.0 in | Wt 228.5 lb

## 2019-07-28 DIAGNOSIS — Z0000 Encounter for general adult medical examination without abnormal findings: Secondary | ICD-10-CM

## 2019-07-28 DIAGNOSIS — I10 Essential (primary) hypertension: Secondary | ICD-10-CM | POA: Diagnosis not present

## 2019-07-28 DIAGNOSIS — Z0001 Encounter for general adult medical examination with abnormal findings: Secondary | ICD-10-CM | POA: Diagnosis not present

## 2019-07-28 DIAGNOSIS — N529 Male erectile dysfunction, unspecified: Secondary | ICD-10-CM | POA: Diagnosis not present

## 2019-07-28 DIAGNOSIS — E782 Mixed hyperlipidemia: Secondary | ICD-10-CM | POA: Diagnosis not present

## 2019-07-28 DIAGNOSIS — Z125 Encounter for screening for malignant neoplasm of prostate: Secondary | ICD-10-CM

## 2019-07-28 MED ORDER — SILDENAFIL CITRATE 20 MG PO TABS: ORAL_TABLET | ORAL | 3 refills | Status: DC

## 2019-07-28 MED ORDER — AMLODIPINE BESYLATE 10 MG PO TABS: 10.0000 mg | ORAL_TABLET | Freq: Every day | ORAL | 3 refills | Status: DC

## 2019-07-28 MED ORDER — ATORVASTATIN CALCIUM 10 MG PO TABS: 10.0000 mg | ORAL_TABLET | Freq: Every day | ORAL | 3 refills | Status: DC

## 2019-07-28 NOTE — Progress Notes (Signed)
BP 126/72   Pulse 66   Temp (!) 97 F (36.1 C) (Temporal)   Ht '5\' 8"'  (1.727 m)   Wt 228 lb 8 oz (103.6 kg)   BMI 34.74 kg/m    Subjective:   Patient ID: John Mejia, male    DOB: 06/05/1950, 69 y.o.   MRN: [id number]  HPI: John Mejia is a 69 y.o. male presenting on 07/28/2019 for Annual Exam   HPI Adult well exam and physical Patient is coming in today for adult well exam and physical.  He also is coming in for recheck of chronic issues. Patient denies any chest pain, shortness of breath, headaches or vision issues, abdominal complaints, diarrhea, nausea, vomiting, or joint issues.  Patient does use sildenafil for erectile dysfunction and works well.  Hypertension Patient is currently on amlodipine, and their blood pressure today is 126/72. Patient denies any lightheadedness or dizziness. Patient denies headaches, blurred vision, chest pains, shortness of breath, or weakness. Denies any side effects from medication and is content with current medication.   Hyperlipidemia Patient is coming in for recheck of his hyperlipidemia. The patient is currently taking atorvastatin. They deny any issues with myalgias or history of liver damage from it. They deny any focal numbness or weakness or chest pain.   Relevant past medical, surgical, family and social history reviewed and updated as indicated. Interim medical history since our last visit reviewed. Allergies and medications reviewed and updated.  Review of Systems  Constitutional: Negative for chills and fever.  HENT: Negative for ear pain and tinnitus.   Eyes: Negative for pain and visual disturbance.  Respiratory: Negative for cough, shortness of breath and wheezing.   Cardiovascular: Negative for chest pain, palpitations and leg swelling.  Gastrointestinal: Negative for abdominal pain, blood in stool, constipation and diarrhea.  Genitourinary: Negative for dysuria and hematuria.  Musculoskeletal: Negative for back pain, gait  problem and myalgias.  Skin: Negative for rash.  Neurological: Negative for dizziness, weakness and headaches.  Psychiatric/Behavioral: Negative for suicidal ideas.  All other systems reviewed and are negative.   Per HPI unless specifically indicated above   Allergies as of 07/28/2019   No Known Allergies     Medication List       Accurate as of Jul 28, 2019  2:59 PM. If you have any questions, ask your nurse or doctor.        amLODipine 10 MG tablet Commonly known as: NORVASC Take 1 tablet (10 mg total) by mouth daily.   aspirin 81 MG EC tablet Take 81 mg by mouth daily. Swallow whole.   atorvastatin 10 MG tablet Commonly known as: LIPITOR Take 1 tablet (10 mg total) by mouth daily.   co-enzyme Q-10 30 MG capsule Take 30 mg by mouth daily.   sildenafil 20 MG tablet Commonly known as: REVATIO TAKE 2-5 TABLETS BY MOUTH AS NEEDED FOR SEXUAL ACTIVITY   Vitamin D3 Complete Tabs Take by mouth.        Objective:   BP 126/72   Pulse 66   Temp (!) 97 F (36.1 C) (Temporal)   Ht '5\' 8"'  (1.727 m)   Wt 228 lb 8 oz (103.6 kg)   BMI 34.74 kg/m   Wt Readings from Last 3 Encounters:  07/28/19 228 lb 8 oz (103.6 kg)  05/14/18 222 lb (100.7 kg)  04/16/18 225 lb (102.1 kg)    Physical Exam Vitals and nursing note reviewed.  Constitutional:      General: He  is not in acute distress.    Appearance: He is well-developed. He is not diaphoretic.  HENT:     Right Ear: External ear normal.     Left Ear: External ear normal.     Nose: Nose normal.     Mouth/Throat:     Pharynx: No oropharyngeal exudate.  Eyes:     General: No scleral icterus.       Right eye: No discharge.     Conjunctiva/sclera: Conjunctivae normal.     Pupils: Pupils are equal, round, and reactive to light.  Neck:     Thyroid: No thyromegaly.  Cardiovascular:     Rate and Rhythm: Normal rate and regular rhythm.     Heart sounds: Normal heart sounds. No murmur.  Pulmonary:     Effort: Pulmonary  effort is normal. No respiratory distress.     Breath sounds: Normal breath sounds. No wheezing.  Abdominal:     General: Bowel sounds are normal. There is no distension.     Palpations: Abdomen is soft.     Tenderness: There is no abdominal tenderness. There is no guarding or rebound.  Musculoskeletal:        General: Normal range of motion.     Cervical back: Neck supple.  Lymphadenopathy:     Cervical: No cervical adenopathy.  Skin:    General: Skin is warm and dry.     Findings: No rash.  Neurological:     Mental Status: He is alert and oriented to person, place, and time.     Coordination: Coordination normal.  Psychiatric:        Behavior: Behavior normal.       Assessment & Plan:   Problem List Items Addressed This Visit      Cardiovascular and Mediastinum   Hypertension   Relevant Medications   aspirin 81 MG EC tablet   atorvastatin (LIPITOR) 10 MG tablet   amLODipine (NORVASC) 10 MG tablet   sildenafil (REVATIO) 20 MG tablet   Other Relevant Orders   CBC with Differential/Platelet   CMP14+EGFR     Other   Hyperlipidemia   Relevant Medications   aspirin 81 MG EC tablet   atorvastatin (LIPITOR) 10 MG tablet   amLODipine (NORVASC) 10 MG tablet   sildenafil (REVATIO) 20 MG tablet   Other Relevant Orders   Lipid panel   Erectile dysfunction   Relevant Medications   sildenafil (REVATIO) 20 MG tablet    Other Visit Diagnoses    Well adult exam    -  Primary   Prostate cancer screening       Relevant Orders   PSA, total and free      Continue current medication, no change. Follow up plan: Return in about 1 year (around 07/27/2020), or if symptoms worsen or fail to improve, for Adult well exam and physical.  Counseling provided for all of the vaccine components Orders Placed This Encounter  Procedures  . CBC with Differential/Platelet  . CMP14+EGFR  . Lipid panel  . PSA, total and free    Caryl Pina, MD [facility] 07/28/2019, 2:59 PM

## 2019-07-28 NOTE — Addendum Note (Signed)
Addended by: Arville Care on: 07/28/2019 03:07 PM   Modules accepted: Orders

## 2019-07-29 LAB — CBC WITH DIFFERENTIAL/PLATELET
Basophils Absolute: 0.1 10*3/uL (ref 0.0–0.2)
Basos: 1 %
EOS (ABSOLUTE): 0.2 10*3/uL (ref 0.0–0.4)
Eos: 5 %
Hematocrit: 39.9 % (ref 37.5–51.0)
Hemoglobin: 14 g/dL (ref 13.0–17.7)
Immature Grans (Abs): 0 10*3/uL (ref 0.0–0.1)
Immature Granulocytes: 0 %
Lymphocytes Absolute: 1.5 10*3/uL (ref 0.7–3.1)
Lymphs: 30 %
MCH: 28.1 pg (ref 26.6–33.0)
MCHC: 35.1 g/dL (ref 31.5–35.7)
MCV: 80 fL (ref 79–97)
Monocytes Absolute: 0.5 10*3/uL (ref 0.1–0.9)
Monocytes: 11 %
Neutrophils Absolute: 2.6 10*3/uL (ref 1.4–7.0)
Neutrophils: 53 %
Platelets: 225 10*3/uL (ref 150–450)
RBC: 4.99 x10E6/uL (ref 4.14–5.80)
RDW: 13.3 % (ref 11.6–15.4)
WBC: 4.8 10*3/uL (ref 3.4–10.8)

## 2019-07-29 LAB — LIPID PANEL
Chol/HDL Ratio: 2.2 ratio (ref 0.0–5.0)
Cholesterol, Total: 115 mg/dL (ref 100–199)
HDL: 52 mg/dL (ref >39)
LDL Chol Calc (NIH): 51 mg/dL (ref 0–99)
Triglycerides: 48 mg/dL (ref 0–149)
VLDL Cholesterol Cal: 12 mg/dL (ref 5–40)

## 2019-07-29 LAB — CMP14+EGFR
ALT: 22 IU/L (ref 0–44)
AST: 21 IU/L (ref 0–40)
Albumin/Globulin Ratio: 1.5 (ref 1.2–2.2)
Albumin: 4.3 g/dL (ref 3.8–4.8)
Alkaline Phosphatase: 88 IU/L (ref 39–117)
BUN/Creatinine Ratio: 12 (ref 10–24)
BUN: 12 mg/dL (ref 8–27)
Bilirubin Total: 0.6 mg/dL (ref 0.0–1.2)
CO2: 26 mmol/L (ref 20–29)
Calcium: 11.2 mg/dL — ABNORMAL HIGH (ref 8.6–10.2)
Chloride: 106 mmol/L (ref 96–106)
Creatinine, Ser: 1.01 mg/dL (ref 0.76–1.27)
GFR calc Af Amer: 88 mL/min/{1.73_m2} (ref >59)
GFR calc non Af Amer: 76 mL/min/{1.73_m2} (ref >59)
Globulin, Total: 2.8 g/dL (ref 1.5–4.5)
Glucose: 90 mg/dL (ref 65–99)
Potassium: 4.4 mmol/L (ref 3.5–5.2)
Sodium: 141 mmol/L (ref 134–144)
Total Protein: 7.1 g/dL (ref 6.0–8.5)

## 2019-07-29 LAB — PSA, TOTAL AND FREE
PSA, Free Pct: 31.3 %
PSA, Free: 0.25 ng/mL
Prostate Specific Ag, Serum: 0.8 ng/mL (ref 0.0–4.0)

## 2019-08-09 ENCOUNTER — Telehealth: Payer: Self-pay | Admitting: Family Medicine

## 2019-08-09 NOTE — Telephone Encounter (Signed)
Aware of results. 

## 2020-01-17 DIAGNOSIS — M5431 Sciatica, right side: Secondary | ICD-10-CM | POA: Diagnosis not present

## 2020-01-31 ENCOUNTER — Ambulatory Visit (INDEPENDENT_AMBULATORY_CARE_PROVIDER_SITE_OTHER): Payer: Medicare Other | Admitting: Nurse Practitioner

## 2020-01-31 ENCOUNTER — Encounter: Payer: Self-pay | Admitting: Nurse Practitioner

## 2020-01-31 ENCOUNTER — Other Ambulatory Visit: Payer: Self-pay

## 2020-01-31 VITALS — BP 148/88 | HR 70 | Temp 97.9°F | Resp 20 | Ht 68.0 in | Wt 229.0 lb

## 2020-01-31 DIAGNOSIS — M5431 Sciatica, right side: Secondary | ICD-10-CM | POA: Diagnosis not present

## 2020-01-31 MED ORDER — MELOXICAM 15 MG PO TABS: 15.0000 mg | ORAL_TABLET | Freq: Every day | ORAL | 0 refills | Status: DC

## 2020-01-31 MED ORDER — KETOROLAC TROMETHAMINE 60 MG/2ML IM SOLN
60.0000 mg | Freq: Once | INTRAMUSCULAR | Status: AC
Start: 2020-01-31 — End: 2020-01-31
  Administered 2020-01-31: 60 mg via INTRAMUSCULAR

## 2020-01-31 NOTE — Patient Instructions (Signed)
Sciatica  Sciatica is pain, weakness, tingling, or loss of feeling (numbness) along the sciatic nerve. The sciatic nerve starts in the lower back and goes down the back of each leg. Sciatica usually goes away on its own or with treatment. Sometimes, sciatica may come back (recur). What are the causes? This condition happens when the sciatic nerve is pinched or has pressure put on it. This may be the result of:  A disk in between the bones of the spine bulging out too far (herniated disk).  Changes in the spinal disks that occur with aging.  A condition that affects a muscle in the butt.  Extra bone growth near the sciatic nerve.  A break (fracture) of the area between your hip bones (pelvis).  Pregnancy.  Tumor. This is rare. What increases the risk? You are more likely to develop this condition if you:  Play sports that put pressure or stress on the spine.  Have poor strength and ease of movement (flexibility).  Have had a back injury in the past.  Have had back surgery.  Sit for long periods of time.  Do activities that involve bending or lifting over and over again.  Are very overweight (obese). What are the signs or symptoms? Symptoms can vary from mild to very bad. They may include:  Any of these problems in the lower back, leg, hip, or butt: ? Mild tingling, loss of feeling, or dull aches. ? Burning sensations. ? Sharp pains.  Loss of feeling in the back of the calf or the sole of the foot.  Leg weakness.  Very bad back pain that makes it hard to move. These symptoms may get worse when you cough, sneeze, or laugh. They may also get worse when you sit or stand for long periods of time. How is this treated? This condition often gets better without any treatment. However, treatment may include:  Changing or cutting back on physical activity when you have pain.  Doing exercises and stretching.  Putting ice or heat on the affected area.  Medicines that  help: ? To relieve pain and swelling. ? To relax your muscles.  Shots (injections) of medicines that help to relieve pain, irritation, and swelling.  Surgery. Follow these instructions at home: Medicines  Take over-the-counter and prescription medicines only as told by your doctor.  Ask your doctor if the medicine prescribed to you: ? Requires you to avoid driving or using heavy machinery. ? Can cause trouble pooping (constipation). You may need to take these steps to prevent or treat trouble pooping:  Drink enough fluids to keep your pee (urine) pale yellow.  Take over-the-counter or prescription medicines.  Eat foods that are high in fiber. These include beans, whole grains, and fresh fruits and vegetables.  Limit foods that are high in fat and sugar. These include fried or sweet foods. Managing pain      If told, put ice on the affected area. ? Put ice in a plastic bag. ? Place a towel between your skin and the bag. ? Leave the ice on for 20 minutes, 2-3 times a day.  If told, put heat on the affected area. Use the heat source that your doctor tells you to use, such as a moist heat pack or a heating pad. ? Place a towel between your skin and the heat source. ? Leave the heat on for 20-30 minutes. ? Remove the heat if your skin turns bright red. This is very important if you are   unable to feel pain, heat, or cold. You may have a greater risk of getting burned. Activity   Return to your normal activities as told by your doctor. Ask your doctor what activities are safe for you.  Avoid activities that make your symptoms worse.  Take short rests during the day. ? When you rest for a long time, do some physical activity or stretching between periods of rest. ? Avoid sitting for a long time without moving. Get up and move around at least one time each hour.  Exercise and stretch regularly, as told by your doctor.  Do not lift anything that is heavier than 10 lb (4.5 kg)  while you have symptoms of sciatica. ? Avoid lifting heavy things even when you do not have symptoms. ? Avoid lifting heavy things over and over.  When you lift objects, always lift in a way that is safe for your body. To do this, you should: ? Bend your knees. ? Keep the object close to your body. ? Avoid twisting. General instructions  Stay at a healthy weight.  Wear comfortable shoes that support your feet. Avoid wearing high heels.  Avoid sleeping on a mattress that is too soft or too hard. You might have less pain if you sleep on a mattress that is firm enough to support your back.  Keep all follow-up visits as told by your doctor. This is important. Contact a doctor if:  You have pain that: ? Wakes you up when you are sleeping. ? Gets worse when you lie down. ? Is worse than the pain you have had in the past. ? Lasts longer than 4 weeks.  You lose weight without trying. Get help right away if:  You cannot control when you pee (urinate) or poop (have a bowel movement).  You have weakness in any of these areas and it gets worse: ? Lower back. ? The area between your hip bones. ? Butt. ? Legs.  You have redness or swelling of your back.  You have a burning feeling when you pee. Summary  Sciatica is pain, weakness, tingling, or loss of feeling (numbness) along the sciatic nerve.  This condition happens when the sciatic nerve is pinched or has pressure put on it.  Sciatica can cause pain, tingling, or loss of feeling (numbness) in the lower back, legs, hips, and butt.  Treatment often includes rest, exercise, medicines, and putting ice or heat on the affected area. This information is not intended to replace advice given to you by your health care provider. Make sure you discuss any questions you have with your health care provider. Document Revised: 03/30/2018 Document Reviewed: 03/30/2018 Elsevier Patient Education  2020 [employer] Inc[employer]  

## 2020-01-31 NOTE — Progress Notes (Signed)
Subjective:    Patient ID: Sonia Baller, male    DOB: 1950/07/01, 69 y.o.   MRN: [id number]   Chief Complaint: Hip Pain (Right. Been going on for 1 month)   HPI Right hip pain x 1 month, denies injury. Pain begins in right hip and radiates down lateral aspect in a sharp shooting pain. Was seen at [facility] 2 weeks, given steroids and muscle relaxer without relief, no imaging. Has been affecting sleep. Change in position makes pain better. Driving is painful. Bending movements/sitting exacerbates pain. 8/10 pain    Review of Systems  Constitutional: Negative.   HENT: Negative.   Eyes: Negative.   Respiratory: Negative.   Cardiovascular: Negative.   Gastrointestinal: Negative.   Endocrine: Negative.   Genitourinary: Negative.   Musculoskeletal: Negative.        Right hip pain  Skin: Negative.   Allergic/Immunologic: Negative.   Neurological: Negative.   Hematological: Negative.   Psychiatric/Behavioral: Negative.   All other systems reviewed and are negative.      Objective:   Physical Exam Vitals and nursing note reviewed.  HENT:     Head: Normocephalic and atraumatic.     Right Ear: External ear normal.     Left Ear: External ear normal.     Nose: Nose normal.     Mouth/Throat:     Mouth: Mucous membranes are moist.     Pharynx: Oropharynx is clear.  Eyes:     Pupils: Pupils are equal, round, and reactive to light.  Cardiovascular:     Rate and Rhythm: Normal rate.     Pulses: Normal pulses.  Pulmonary:     Effort: Pulmonary effort is normal.  Abdominal:     General: Abdomen is flat.  Musculoskeletal:        General: Normal range of motion.     Cervical back: Normal range of motion.  Skin:    General: Skin is warm and dry.     Capillary Refill: Capillary refill takes less than 2 seconds.  Neurological:     General: No focal deficit present.     Mental Status: He is alert and oriented to person, place, and time. Mental status is at baseline.  Psychiatric:          Mood and Affect: Mood normal.        Behavior: Behavior normal.        Thought Content: Thought content normal.        Judgment: Judgment normal.     BP (!) 148/88   Pulse 70   Temp 97.9 F (36.6 C) (Temporal)   Resp 20   Ht 5\' 8"  (1.727 m)   Wt 229 lb (103.9 kg)   SpO2 99%   BMI 34.82 kg/m        Assessment & Plan:  [facility] in today with chief complaint of Hip Pain (Right. Been going on for 1 month)   1. Sciatica of right side Moist heat Rest Let [facility] knowis does not improve. - meloxicam (MOBIC) 15 MG tablet; Take 1 tablet (15 mg total) by mouth daily.  Dispense: 30 tablet; Refill: 0 - ketorolac (TORADOL) injection 60 mg    The above assessment and management plan was discussed with the patient. The patient verbalized understanding of and has agreed to the management plan. Patient is aware to call the clinic if symptoms persist or worsen. Patient is aware when to return to the clinic for a follow-up visit. Patient educated on  when it is appropriate to go to the emergency department.   Mary-Margaret Hassell Done, FNP

## 2020-03-01 ENCOUNTER — Other Ambulatory Visit: Payer: Self-pay | Admitting: Nurse Practitioner

## 2020-03-01 DIAGNOSIS — M5431 Sciatica, right side: Secondary | ICD-10-CM

## 2020-04-24 ENCOUNTER — Ambulatory Visit (INDEPENDENT_AMBULATORY_CARE_PROVIDER_SITE_OTHER): Payer: Medicare Other | Admitting: Family Medicine

## 2020-04-24 ENCOUNTER — Other Ambulatory Visit: Payer: Self-pay

## 2020-04-24 ENCOUNTER — Encounter: Payer: Self-pay | Admitting: Family Medicine

## 2020-04-24 VITALS — BP 146/75 | HR 71 | Temp 97.3°F | Ht 68.0 in | Wt 225.2 lb

## 2020-04-24 DIAGNOSIS — R109 Unspecified abdominal pain: Secondary | ICD-10-CM

## 2020-04-24 DIAGNOSIS — Z87442 Personal history of urinary calculi: Secondary | ICD-10-CM

## 2020-04-24 LAB — URINALYSIS, COMPLETE
Bilirubin, UA: NEGATIVE
Glucose, UA: NEGATIVE
Ketones, UA: NEGATIVE
Leukocytes,UA: NEGATIVE
Nitrite, UA: NEGATIVE
Protein,UA: NEGATIVE
Specific Gravity, UA: 1.015 (ref 1.005–1.030)
Urobilinogen, Ur: 0.2 mg/dL (ref 0.2–1.0)
pH, UA: 5.5 (ref 5.0–7.5)

## 2020-04-24 LAB — MICROSCOPIC EXAMINATION
Bacteria, UA: NONE SEEN
RBC, Urine: NONE SEEN /hpf (ref 0–2)
WBC, UA: NONE SEEN /hpf (ref 0–5)

## 2020-04-24 NOTE — Progress Notes (Signed)
.   Established Patient Office Visit  Subjective:  Patient ID: John Mejia, male    DOB: 1950/12/06  Age: 70 y.o. MRN: [id number]  CC:  Chief Complaint  Patient presents with  . Flank Pain    HPI [facility] presents for flank pain.  1. Flank pain John Mejia reports left sided flank pain that started about a week ago. Over the weekend he had worsened left flank pain with nausea. He also had some dysuria then as well. He took some left over hydrocodone for the pain over the weekend. The pain and dysuria has now resolved. He denies vomiting or nausea currenty. Denies fever, abdominal pain, or penile discharge. He has been taking flomax for the last week. He has also taken OTC stone busters. He has a history of kidney stones in the past and had the same pain. Since the pain is resolved now he thinks the stone must have passed.   Past Medical History:  Diagnosis Date  . Asthma   . Cataract   . Eczema   . Erectile dysfunction   . Hyperlipidemia   . Hypertension   . Multiple allergies   . Right-sided carotid artery disease [facility])     Past Surgical History:  Procedure Laterality Date  . COLONOSCOPY     2009  . VASECTOMY      Family History  Problem Relation Age of Onset  . Hypertension Father   . Stroke Father 76  . Heart disease Father        "Enlarged heart"  . Hypertension Mother   . Cancer Sister        ovarian cancer / fibroid tumors    Social History   Socioeconomic History  . Marital status: Married    Spouse name: Not on file  . Number of children: 0  . Years of education: some trade school  . Highest education level: Some college, no degree  Occupational History  . Occupation: farmer    Comment: Currently farming part time  . Occupation: Chartered certified accountant    Comment: retired  Tobacco Use  . Smoking status: Former Smoker    Packs/day: 0.10    Years: 4.00    Pack years: 0.40    Types: Cigarettes    Quit date: 03/25/1978    Years since quitting: 42.1  .  Smokeless tobacco: Never Used  . Tobacco comment: Only smoked socially   Vaping Use  . Vaping Use: Never used  Substance and Sexual Activity  . Alcohol use: Not Currently  . Drug use: No  . Sexual activity: Yes  Other Topics Concern  . Not on file  Social History Narrative   Patient is retired and lives at home with his wife. They do not have any children.    Social Determinants of Health   Financial Resource Strain: Not on file  Food Insecurity: Not on file  Transportation Needs: Not on file  Physical Activity: Not on file  Stress: Not on file  Social Connections: Not on file  Intimate Partner Violence: Not on file    Outpatient Medications Prior to Visit  Medication Sig Dispense Refill  . amLODipine (NORVASC) 10 MG tablet Take 1 tablet (10 mg total) by mouth daily. 90 tablet 3  . aspirin 81 MG EC tablet Take 81 mg by mouth daily. Swallow whole.    Marland Kitchen atorvastatin (LIPITOR) 10 MG tablet Take 1 tablet (10 mg total) by mouth daily. 90 tablet 3  . co-enzyme Q-10 30 MG  capsule Take 30 mg by mouth daily.    . meloxicam (MOBIC) 15 MG tablet TAKE 1 TABLET BY MOUTH EVERY DAY 30 tablet 1  . sildenafil (REVATIO) 20 MG tablet TAKE 2-5 TABLETS BY MOUTH AS NEEDED FOR SEXUAL ACTIVITY 90 tablet 3  . Multiple Vitamins-Minerals (VITAMIN D3 COMPLETE) TABS Take by mouth.     No facility-administered medications prior to visit.    No Known Allergies  ROS Review of Systems As per HPI.    Objective:    Physical Exam Vitals and nursing note reviewed.  Constitutional:      Appearance: Normal appearance.  Neck:     Vascular: No carotid bruit.  Cardiovascular:     Rate and Rhythm: Normal rate and regular rhythm.     Heart sounds: Normal heart sounds. No murmur heard.   Pulmonary:     Effort: Pulmonary effort is normal. No respiratory distress.     Breath sounds: Normal breath sounds.  Abdominal:     General: Bowel sounds are normal. There is no distension.     Palpations: Abdomen  is soft.     Tenderness: There is no abdominal tenderness. There is no right CVA tenderness, left CVA tenderness, guarding or rebound.  Musculoskeletal:     Cervical back: Neck supple. No tenderness.     Right lower leg: No edema.     Left lower leg: No edema.  Skin:    General: Skin is warm and dry.  Neurological:     General: No focal deficit present.     Mental Status: He is alert and oriented to person, place, and time.  Psychiatric:        Mood and Affect: Mood normal.        Behavior: Behavior normal.     BP (!) 146/75   Pulse 71   Temp (!) 97.3 F (36.3 C) (Temporal)   Ht 5\' 8"  (1.727 m)   Wt 225 lb 4 oz (102.2 kg)   BMI 34.25 kg/m  Wt Readings from Last 3 Encounters:  04/24/20 225 lb 4 oz (102.2 kg)  01/31/20 229 lb (103.9 kg)  07/28/19 228 lb 8 oz (103.6 kg)   Urine dipstick shows trace positive for RBC's.  Micro exam: negative for WBC's or RBC's, negative for bacteria.   Health Maintenance Due  Topic Date Due  . COVID-19 Vaccine (3 - Booster for Pfizer series) 12/16/2019    There are no preventive care reminders to display for this patient.  Lab Results  Component Value Date   TSH 1.010 10/25/2015   Lab Results  Component Value Date   WBC 4.8 07/28/2019   HGB 14.0 07/28/2019   HCT 39.9 07/28/2019   MCV 80 07/28/2019   PLT 225 07/28/2019   Lab Results  Component Value Date   NA 141 07/28/2019   K 4.4 07/28/2019   CO2 26 07/28/2019   GLUCOSE 90 07/28/2019   BUN 12 07/28/2019   CREATININE 1.01 07/28/2019   BILITOT 0.6 07/28/2019   ALKPHOS 88 07/28/2019   AST 21 07/28/2019   ALT 22 07/28/2019   PROT 7.1 07/28/2019   ALBUMIN 4.3 07/28/2019   CALCIUM 11.2 (H) 07/28/2019   Lab Results  Component Value Date   CHOL 115 07/28/2019   Lab Results  Component Value Date   HDL 52 07/28/2019   Lab Results  Component Value Date   LDLCALC 51 07/28/2019   Lab Results  Component Value Date   TRIG 48 07/28/2019  Lab Results  Component Value  Date   CHOLHDL 2.2 07/28/2019   Lab Results  Component Value Date   HGBA1C 5.1 05/16/2017      Assessment & Plan:   [facility] was seen today for flank pain.  Diagnoses and all orders for this visit:  Flank pain Resolved. Normal UA today at visit. Discussed that pain was likely due to a kidney stone that passed. Return to office for new or worsening symptoms, or if symptoms return.  -     Urinalysis, Complete  History of kidney stones Established with urology. Urology office contact info given to patient.    Follow-up: Return if symptoms worsen or fail to improve.   The patient indicates understanding of these issues and agrees with the plan.   Gabriel Earing, FNP

## 2020-04-24 NOTE — Patient Instructions (Signed)
[facility] [address] [place], [place] [address] [phone]  Kidney Stones  Kidney stones are solid, rock-like deposits that form inside of the kidneys. The kidneys are a pair of organs that make urine. A kidney stone may form in a kidney and move into other parts of the urinary tract, including the tubes that connect the kidneys to the bladder (ureters), the bladder, and the tube that carries urine out of the body (urethra). As the stone moves through these areas, it can cause intense pain and block the flow of urine. Kidney stones are created when high levels of certain minerals are found in the urine. The stones are usually passed out of the body through urination, but in some cases, medical treatment may be needed to remove them. What are the causes? Kidney stones may be caused by:  A condition in which certain glands produce too much parathyroid hormone (primary hyperparathyroidism), which causes too much calcium buildup in the blood.  A buildup of uric acid crystals in the bladder (hyperuricosuria). Uric acid is a chemical that the body produces when you eat certain foods. It usually exits the body in the urine.  Narrowing (stricture) of one or both of the ureters.  A kidney blockage that is present at birth (congenital obstruction).  Past surgery on the kidney or the ureters, such as gastric bypass surgery. What increases the risk? The following factors may make you more likely to develop this condition:  Having had a kidney stone in the past.  Having a family history of kidney stones.  Not drinking enough water.  Eating a diet that is high in protein, salt (sodium), or sugar.  Being overweight or obese. What are the signs or symptoms? Symptoms of a kidney stone may include:  Pain in the side of the abdomen, right below the ribs (flank pain). Pain usually spreads (radiates) to the groin.  Needing to urinate frequently or urgently.  Painful  urination.  Blood in the urine (hematuria).  Nausea.  Vomiting.  Fever and chills. How is this diagnosed? This condition may be diagnosed based on:  Your symptoms and medical history.  A physical exam.  Blood tests.  Urine tests. These may be done before and after the stone passes out of your body through urination.  Imaging tests, such as a CT scan, abdominal X-ray, or ultrasound.  A procedure to examine the inside of the bladder (cystoscopy). How is this treated? Treatment for kidney stones depends on the size, location, and makeup of the stones. Kidney stones will often pass out of the body through urination. You may need to:  Increase your fluid intake to help pass the stone. In some cases, you may be given fluids through an IV and may need to be monitored at the hospital.  Take medicine for pain.  Make changes in your diet to help prevent kidney stones from coming back. Sometimes, medical procedures are needed to remove a kidney stone. This may involve:  A procedure to break up kidney stones using: ? A focused beam of light (laser therapy). ? Shock waves (extracorporeal shock wave lithotripsy).  Surgery to remove kidney stones. This may be needed if you have severe pain or have stones that block your urinary tract. Follow these instructions at home: Medicines  Take over-the-counter and prescription medicines only as told by your health care provider.  Ask your health care provider if the medicine prescribed to you requires you to avoid driving or using heavy  machinery. Eating and drinking  Drink enough fluid to keep your urine pale yellow. You may be instructed to drink at least 8-10 glasses of water each day. This will help you pass the kidney stone.  If directed, change your diet. This may include: ? Limiting how much sodium you eat. ? Eating more fruits and vegetables. ? Limiting how much animal protein-such as red meat, poultry, fish, and eggs-you  eat.  Follow instructions from your health care provider about eating or drinking restrictions. General instructions  Collect urine samples as told by your health care provider. You may need to collect a urine sample: ? 24 hours after you pass the stone. ? 8-12 weeks after passing the kidney stone, and every 6-12 months after that.  Strain your urine every time you urinate, for as long as directed. Use the strainer that your health care provider recommends.  Do not throw out the kidney stone after passing it. Keep the stone so it can be tested by your health care provider. Testing the makeup of your kidney stone may help prevent you from getting kidney stones in the future.  Keep all follow-up visits as told by your health care provider. This is important. You may need follow-up X-rays or ultrasounds to make sure that your stone has passed. How is this prevented? To prevent another kidney stone:  Drink enough fluid to keep your urine pale yellow. This is the best way to prevent kidney stones.  Eat a healthy diet and follow recommendations from your health care provider about foods to avoid. You may be instructed to eat a low-protein diet. Recommendations vary depending on the type of kidney stone that you have.  Maintain a healthy weight.   Where to find more information  National Kidney Foundation (NKF): www.kidney.org  [facility]): www.urologyhealth.org Contact a health care provider if:  You have pain that gets worse or does not get better with medicine. Get help right away if:  You have a fever or chills.  You develop severe pain.  You develop new abdominal pain.  You faint.  You are unable to urinate. Summary  Kidney stones are solid, rock-like deposits that form inside of the kidneys.  Kidney stones can cause nausea, vomiting, blood in the urine, abdominal pain, and the urge to urinate frequently.  Treatment for kidney stones depends on the size,  location, and makeup of the stones. Kidney stones will often pass out of the body through urination.  Kidney stones can be prevented by drinking enough fluids, eating a healthy diet, and maintaining a healthy weight. This information is not intended to replace advice given to you by your health care provider. Make sure you discuss any questions you have with your health care provider. Document Revised: 07/28/2018 Document Reviewed: 07/28/2018 Elsevier Patient Education  2021 [employer].

## 2020-06-25 ENCOUNTER — Other Ambulatory Visit: Payer: Self-pay | Admitting: Family Medicine

## 2020-06-25 DIAGNOSIS — E782 Mixed hyperlipidemia: Secondary | ICD-10-CM

## 2020-06-30 ENCOUNTER — Ambulatory Visit (INDEPENDENT_AMBULATORY_CARE_PROVIDER_SITE_OTHER): Payer: Medicare Other

## 2020-06-30 VITALS — Ht 68.0 in | Wt 223.0 lb

## 2020-06-30 DIAGNOSIS — Z0000 Encounter for general adult medical examination without abnormal findings: Secondary | ICD-10-CM

## 2020-06-30 NOTE — Progress Notes (Signed)
Subjective:   [facility] John Mejia is a 70 y.o. male who presents for Medicare Annual/Subsequent preventive examination.  Virtual Visit via Telephone Note  I connected with  John Mejia on 06/30/20 at  1:15 PM EDT by telephone and verified that I am speaking with the correct person using two identifiers.  Location: Patient: Home Provider: [facility] Persons participating in the virtual visit: patient/Nurse Health Advisor   I discussed the limitations, risks, security and privacy concerns of performing an evaluation and management service by telephone and the availability of in person appointments. The patient expressed understanding and agreed to proceed.  Interactive audio and video telecommunications were attempted between this nurse and patient, however failed, due to patient having technical difficulties OR patient did not have access to video capability.  We continued and completed visit with audio only.  Some vital signs may be absent or patient reported.   Keaira Whitehurst E Veora Fonte, LPN   Review of Systems     Cardiac Risk Factors include: advanced age (>81men, >41 women);dyslipidemia;hypertension;male gender;obesity (BMI >30kg/m2)     Objective:    Today's Vitals   06/30/20 1251  Weight: 223 lb (101.2 kg)  Height: 5\' 8"  (1.727 m)   Body mass index is 33.91 kg/m.  Advanced Directives 06/30/2019 04/16/2018 04/09/2017  Does Patient Have a Medical Advance Directive? Yes Yes Yes  Type of 04/11/2017 of Estate agent Power of [place];Living will Healthcare Power of Attorney  Does patient want to make changes to medical advance directive? No - Patient declined - -  Copy of Healthcare Power of Attorney in Chart? - Yes - validated most recent copy scanned in chart (See row information) Yes    Current Medications (verified) Outpatient Encounter Medications as of 06/30/2020  Medication Sig  . amLODipine (NORVASC) 10 MG tablet Take 1 tablet (10 mg total) by mouth  daily.  08/30/2020 aspirin 81 MG EC tablet Take 81 mg by mouth daily. Swallow whole.  Marland Kitchen atorvastatin (LIPITOR) 10 MG tablet Take 1 tablet (10 mg total) by mouth daily. (NEEDS TO BE SEEN BEFORE NEXT REFILL)  . co-enzyme Q-10 30 MG capsule Take 30 mg by mouth daily.  . sildenafil (REVATIO) 20 MG tablet TAKE 2-5 TABLETS BY MOUTH AS NEEDED FOR SEXUAL ACTIVITY  . meloxicam (MOBIC) 15 MG tablet TAKE 1 TABLET BY MOUTH EVERY DAY (Patient not taking: Reported on 06/30/2020)  . tiZANidine (ZANAFLEX) 4 MG tablet Take 4 mg by mouth at bedtime. (Patient not taking: Reported on 06/30/2020)   No facility-administered encounter medications on file as of 06/30/2020.    Allergies (verified) Patient has no known allergies.   History: Past Medical History:  Diagnosis Date  . Asthma   . Cataract   . Eczema   . Erectile dysfunction   . Hyperlipidemia   . Hypertension   . Multiple allergies   . Right-sided carotid artery disease [facility])    Past Surgical History:  Procedure Laterality Date  . COLONOSCOPY     2009  . VASECTOMY     Family History  Problem Relation Age of Onset  . Hypertension Father   . Stroke Father 6  . Heart disease Father        "Enlarged heart"  . Hypertension Mother   . Cancer Sister        ovarian cancer / fibroid tumors   Social History   Socioeconomic History  . Marital status: Married    Spouse name: Not on file  . Number  of children: 0  . Years of education: some trade school  . Highest education level: Some college, no degree  Occupational History  . Occupation: farmer    Comment: Currently farming part time  . Occupation: Chartered certified accountant    Comment: retired  Tobacco Use  . Smoking status: Former Smoker    Packs/day: 0.10    Years: 4.00    Pack years: 0.40    Types: Cigarettes    Quit date: 03/25/1978    Years since quitting: 42.2  . Smokeless tobacco: Never Used  . Tobacco comment: Only smoked socially   Vaping Use  . Vaping Use: Never used  Substance and Sexual  Activity  . Alcohol use: Not Currently  . Drug use: No  . Sexual activity: Yes  Other Topics Concern  . Not on file  Social History Narrative   Patient is retired and lives at home with his wife. They do not have any children.    Social Determinants of Health   Financial Resource Strain: Low Risk   . Difficulty of Paying Living Expenses: Not hard at all  Food Insecurity: No Food Insecurity  . Worried About Programme researcher, broadcasting/film/video in the Last Year: Never true  . Ran Out of Food in the Last Year: Never true  Transportation Needs: No Transportation Needs  . Lack of Transportation (Medical): No  . Lack of Transportation (Non-Medical): No  Physical Activity: Insufficiently Active  . Days of Exercise per Week: 7 days  . Minutes of Exercise per Session: 20 min  Stress: No Stress Concern Present  . Feeling of Stress : Not at all  Social Connections: Socially Integrated  . Frequency of Communication with Friends and Family: More than three times a week  . Frequency of Social Gatherings with Friends and Family: More than three times a week  . Attends Religious Services: More than 4 times per year  . Active Member of Clubs or Organizations: Yes  . Attends Banker Meetings: More than 4 times per year  . Marital Status: Married    Tobacco Counseling Counseling given: Not Answered Comment: Only smoked socially    Clinical Intake:  Pre-visit preparation completed: Yes  Pain : No/denies pain     BMI - recorded: 33.91 Nutritional Status: BMI > 30  Obese Nutritional Risks: None Diabetes: No  How often do you need to have someone help you when you read instructions, pamphlets, or other written materials from your doctor or pharmacy?: 1 - Never  Diabetic? no  Interpreter Needed?: No  Information entered by :: Krina Mraz, LPN   Activities of Daily Living In your present state of health, do you have any difficulty performing the following activities: 06/30/2020   Hearing? Y  Vision? N  Difficulty concentrating or making decisions? N  Walking or climbing stairs? N  Dressing or bathing? N  Doing errands, shopping? N  Preparing Food and eating ? N  Using the Toilet? N  In the past six months, have you accidently leaked urine? N  Do you have problems with loss of bowel control? N  Managing your Medications? N  Managing your Finances? N  Housekeeping or managing your Housekeeping? N  Some recent data might be hidden    Patient Care Team: Dettinger, Elige Radon, MD as PCP - General (Family Medicine) Rollene Rotunda, MD as Consulting Physician (Cardiology)  Indicate any recent Medical Services you may have received from other than [facility] providers in the past year (date may be  approximate).     Assessment:   This is a routine wellness examination for John Mejia.  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 Blood Pressure < 140/90    . Exercise 150 min/wk Moderate Activity     Using your total gym is a great option.      Depression Screen PHQ 2/9 Scores 06/30/2020 04/24/2020 01/31/2020 07/28/2019 06/30/2019 05/14/2018 04/16/2018  PHQ - 2 Score 0 0 0 0 0 0 0  PHQ- 9 Score - - - - - 0 -    Fall Risk Fall Risk  06/30/2020 04/24/2020 01/31/2020 07/28/2019 06/30/2019  Falls in the past year? 0 0 0 0 0  Comment - - - - -  Number falls in past yr: 0 - - - -   Injury with Fall? 0 - - - -  Risk for fall due to : No Fall Risks - - - -  Follow up Falls prevention discussed - - - -    FALL RISK PREVENTION PERTAINING TO THE HOME:  Any stairs in or around the home? Yes  If so, are there any without handrails? No  Home free of loose throw rugs in walkways, pet beds, electrical cords, etc? Yes  Adequate lighting in your home to reduce risk of falls? Yes   ASSISTIVE DEVICES UTILIZED TO PREVENT FALLS:  Life alert? No  Use of a cane, walker or w/c? No  Grab bars in the bathroom? Yes  Shower chair or bench in shower? No  Elevated toilet seat or a handicapped toilet? No   TIMED UP AND GO:  Was the test performed? No . Telephonic visit.  Cognitive Function: MMSE - Mini Mental State Exam 04/16/2018 04/09/2017 04/08/2016  Orientation to time 5 5 5   Orientation to Place 5 5 5   Registration 3 3 3   Attention/ Calculation 5 5 4   Recall 3 3 3   Language- name 2 objects 2 2 2   Language- repeat 1 1 1   Language- follow 3 step command 3 3 3   Language- read & follow direction 1 1 1   Write a sentence 1 1 1   Copy design 1 1 1   Total score 30 30 29      6CIT Screen 06/30/2020 06/30/2019  What Year? 0 points 0 points  What month? 0 points 0 points  What time? 0 points 0 points  Count back from 20 0 points 0 points  Months in reverse 0 points 0 points  Repeat phrase 0 points 0 points  Total Score 0 0    Immunizations Immunization History  Administered Date(s) Administered  . Influenza, High Dose Seasonal PF 04/08/2016, 03/19/2017, 04/16/2018  . Influenza,inj,Quad PF,6+ Mos 03/01/2015  . PFIZER(Purple Top)SARS-COV-2 Vaccination 05/20/2019, 06/15/2019  . Pneumococcal Conjugate-13 04/08/2016  . Pneumococcal Polysaccharide-23 05/12/2017  . Tdap 11/07/2014    TDAP  status: Up to date  Flu Vaccine status: Declined, Education has been provided regarding the importance of this vaccine but patient still declined. Advised may receive this vaccine at local  pharmacy or Health Dept. Aware to provide a copy of the vaccination record if obtained from local pharmacy or Health Dept. Verbalized acceptance and understanding.  Pneumococcal vaccine status: Up to date  Covid-19 vaccine status: Completed vaccines  Qualifies for Shingles Vaccine? Yes   Zostavax completed No   Shingrix Completed?: No.    Education has been provided regarding the importance of this vaccine. Patient has been advised to call insurance company to determine out of pocket expense if they have not yet received this vaccine. Advised may also receive vaccine at local pharmacy or Health Dept. Verbalized acceptance and understanding.  Screening Tests Health Maintenance  Topic Date Due  . COVID-19 Vaccine (3 - Booster for Pfizer series) 12/16/2019  . INFLUENZA VACCINE  10/23/2020  . TETANUS/TDAP  11/06/2024  . COLONOSCOPY (Pts 45-35yrs Insurance coverage will need to be confirmed)  08/12/2027  . Hepatitis C Screening  Completed  . PNA vac Low Risk Adult  Completed  . HPV VACCINES  Aged Out    Health Maintenance  Health Maintenance Due  Topic Date Due  . COVID-19 Vaccine (3 - Booster for Pfizer series) 12/16/2019    Colorectal cancer screening: Type of screening: Colonoscopy. Completed 08/11/2017. Repeat every 10 years  Lung Cancer Screening: (Low Dose CT Chest recommended if Age 74-80 years, 30 pack-year currently smoking OR have quit w/in 15years.) does not qualify.   Additional Screening:  Hepatitis C Screening: does qualify; Completed 11/07/2014  Vision Screening: Recommended annual ophthalmology exams for early detection of glaucoma and other disorders of the eye. Is the patient up to date with their annual eye exam?  Yes  Who is the provider or what is the name of the office in which the patient attends annual eye exams? Dr Conni Elliot in [place], [place] If pt is not established with a provider, would they like to be referred to a provider to establish care? No .   Dental  Screening: Recommended annual dental exams for proper oral hygiene  Community Resource Referral / Chronic Care Management: CRR required this visit?  No   CCM required this visit?  No      Plan:     I have personally reviewed and noted the following in the patient's chart:   . Medical and social history . Use of alcohol, tobacco or illicit drugs  . Current medications and supplements . Functional ability and status . Nutritional status . Physical activity . Advanced directives . List of other physicians . Hospitalizations, surgeries, and ER visits in previous 12 months . Vitals . Screenings to include cognitive, depression, and falls . Referrals and appointments  In addition, I have reviewed and discussed with patient certain preventive protocols, quality metrics, and best practice recommendations. A written personalized care plan for preventive services as well as general preventive health recommendations were provided to patient.     [place] Constable, LPN   0/10/1446   Nurse Notes: None

## 2020-06-30 NOTE — Patient Instructions (Signed)
John Mejia , Thank you for taking time to come for your Medicare Wellness Visit. I appreciate your ongoing commitment to your health goals. Please review the following plan we discussed and let me know if I can assist you in the future.   Screening recommendations/referrals: Colonoscopy: Done 08/11/2017 - Repeat in 10 years Recommended yearly ophthalmology/optometry visit for glaucoma screening and checkup Recommended yearly dental visit for hygiene and checkup  Vaccinations: Influenza vaccine: Due in Fall 2022 Pneumococcal vaccine: Done 04/08/16 & 05/12/17 Tdap vaccine: Done 11/07/2014 - Repeat in 10 years Shingles vaccine: Shingrix discussed. Please contact your pharmacy for coverage information.    Covid-19: Complete: 05/20/19, 06/15/19 and approximately 12/2019 (bring your immunization card to your next visit)  Conditions/risks identified: Continue staying active and eating healthy.  Next appointment: Follow up in one year for your annual wellness visit.   Preventive Care 23 Years and Older, Male  Preventive care refers to lifestyle choices and visits with your health care provider that can promote health and wellness. What does preventive care include?  A yearly physical exam. This is also called an annual well check.  Dental exams once or twice a year.  Routine eye exams. Ask your health care provider how often you should have your eyes checked.  Personal lifestyle choices, including:  Daily care of your teeth and gums.  Regular physical activity.  Eating a healthy diet.  Avoiding tobacco and drug use.  Limiting alcohol use.  Practicing safe sex.  Taking low doses of aspirin every day.  Taking vitamin and mineral supplements as recommended by your health care provider. What happens during an annual well check? The services and screenings done by your health care provider during your annual well check will depend on your age, overall health, lifestyle risk factors, and  family history of disease. Counseling  Your health care provider may ask you questions about your:  Alcohol use.  Tobacco use.  Drug use.  Emotional well-being.  Home and relationship well-being.  Sexual activity.  Eating habits.  History of falls.  Memory and ability to understand (cognition).  Work and work Astronomer. Screening  You may have the following tests or measurements:  Height, weight, and BMI.  Blood pressure.  Lipid and cholesterol levels. These may be checked every 5 years, or more frequently if you are over 41 years old.  Skin check.  Lung cancer screening. You may have this screening every year starting at age 81 if you have a 30-pack-year history of smoking and currently smoke or have quit within the past 15 years.  Fecal occult blood test (FOBT) of the stool. You may have this test every year starting at age 77.  Flexible sigmoidoscopy or colonoscopy. You may have a sigmoidoscopy every 5 years or a colonoscopy every 10 years starting at age 58.  Prostate cancer screening. Recommendations will vary depending on your family history and other risks.  Hepatitis C blood test.  Hepatitis B blood test.  Sexually transmitted disease (STD) testing.  Diabetes screening. This is done by checking your blood sugar (glucose) after you have not eaten for a while (fasting). You may have this done every 1-3 years.  Abdominal aortic aneurysm (AAA) screening. You may need this if you are a current or former smoker.  Osteoporosis. You may be screened starting at age 2 if you are at high risk. Talk with your health care provider about your test results, treatment options, and if necessary, the need for more tests. Vaccines  Your health care provider may recommend certain vaccines, such as:  Influenza vaccine. This is recommended every year.  Tetanus, diphtheria, and acellular pertussis (Tdap, Td) vaccine. You may need a Td booster every 10 years.  Zoster  vaccine. You may need this after age 66.  Pneumococcal 13-valent conjugate (PCV13) vaccine. One dose is recommended after age 21.  Pneumococcal polysaccharide (PPSV23) vaccine. One dose is recommended after age 70. Talk to your health care provider about which screenings and vaccines you need and how often you need them. This information is not intended to replace advice given to you by your health care provider. Make sure you discuss any questions you have with your health care provider. Document Released: 04/07/2015 Document Revised: 11/29/2015 Document Reviewed: 01/10/2015 Elsevier Interactive Patient Education  2017 [employer] Prevention in the Home Falls can cause injuries. They can happen to people of all ages. There are many things you can do to make your home safe and to help prevent falls. What can I do on the outside of my home?  Regularly fix the edges of walkways and driveways and fix any cracks.  Remove anything that might make you trip as you walk through a door, such as a raised step or threshold.  Trim any bushes or trees on the path to your home.  Use bright outdoor lighting.  Clear any walking paths of anything that might make someone trip, such as rocks or tools.  Regularly check to see if handrails are loose or broken. Make sure that both sides of any steps have handrails.  Any raised decks and porches should have guardrails on the edges.  Have any leaves, snow, or ice cleared regularly.  Use sand or salt on walking paths during winter.  Clean up any spills in your garage right away. This includes oil or grease spills. What can I do in the bathroom?  Use night lights.  Install grab bars by the toilet and in the tub and shower. Do not use towel bars as grab bars.  Use non-skid mats or decals in the tub or shower.  If you need to sit down in the shower, use a plastic, non-slip stool.  Keep the floor dry. Clean up any water that spills on the  floor as soon as it happens.  Remove soap buildup in the tub or shower regularly.  Attach bath mats securely with double-sided non-slip rug tape.  Do not have throw rugs and other things on the floor that can make you trip. What can I do in the bedroom?  Use night lights.  Make sure that you have a light by your bed that is easy to reach.  Do not use any sheets or blankets that are too big for your bed. They should not hang down onto the floor.  Have a firm chair that has side arms. You can use this for support while you get dressed.  Do not have throw rugs and other things on the floor that can make you trip. What can I do in the kitchen?  Clean up any spills right away.  Avoid walking on wet floors.  Keep items that you use a lot in easy-to-reach places.  If you need to reach something above you, use a strong step stool that has a grab bar.  Keep electrical cords out of the way.  Do not use floor polish or wax that makes floors slippery. If you must use wax, use non-skid floor wax.  Do  not have throw rugs and other things on the floor that can make you trip. What can I do with my stairs?  Do not leave any items on the stairs.  Make sure that there are handrails on both sides of the stairs and use them. Fix handrails that are broken or loose. Make sure that handrails are as long as the stairways.  Check any carpeting to make sure that it is firmly attached to the stairs. Fix any carpet that is loose or worn.  Avoid having throw rugs at the top or bottom of the stairs. If you do have throw rugs, attach them to the floor with carpet tape.  Make sure that you have a light switch at the top of the stairs and the bottom of the stairs. If you do not have them, ask someone to add them for you. What else can I do to help prevent falls?  Wear shoes that:  Do not have high heels.  Have rubber bottoms.  Are comfortable and fit you well.  Are closed at the toe. Do not wear  sandals.  If you use a stepladder:  Make sure that it is fully opened. Do not climb a closed stepladder.  Make sure that both sides of the stepladder are locked into place.  Ask someone to hold it for you, if possible.  Clearly mark and make sure that you can see:  Any grab bars or handrails.  First and last steps.  Where the edge of each step is.  Use tools that help you move around (mobility aids) if they are needed. These include:  Canes.  Walkers.  Scooters.  Crutches.  Turn on the lights when you go into a dark area. Replace any light bulbs as soon as they burn out.  Set up your furniture so you have a clear path. Avoid moving your furniture around.  If any of your floors are uneven, fix them.  If there are any pets around you, be aware of where they are.  Review your medicines with your doctor. Some medicines can make you feel dizzy. This can increase your chance of falling. Ask your doctor what other things that you can do to help prevent falls. This information is not intended to replace advice given to you by your health care provider. Make sure you discuss any questions you have with your health care provider. Document Released: 01/05/2009 Document Revised: 08/17/2015 Document Reviewed: 04/15/2014 Elsevier Interactive Patient Education  2017 [employer].

## 2020-07-24 ENCOUNTER — Other Ambulatory Visit: Payer: Self-pay | Admitting: Family Medicine

## 2020-07-24 DIAGNOSIS — I10 Essential (primary) hypertension: Secondary | ICD-10-CM

## 2020-07-25 ENCOUNTER — Other Ambulatory Visit: Payer: Self-pay | Admitting: Family Medicine

## 2020-07-25 DIAGNOSIS — E782 Mixed hyperlipidemia: Secondary | ICD-10-CM

## 2020-07-31 ENCOUNTER — Other Ambulatory Visit: Payer: Self-pay

## 2020-07-31 ENCOUNTER — Ambulatory Visit (INDEPENDENT_AMBULATORY_CARE_PROVIDER_SITE_OTHER): Payer: Medicare Other | Admitting: Family Medicine

## 2020-07-31 ENCOUNTER — Encounter: Payer: Self-pay | Admitting: Family Medicine

## 2020-07-31 VITALS — BP 123/73 | HR 66 | Temp 97.6°F | Ht 68.0 in | Wt 225.0 lb

## 2020-07-31 DIAGNOSIS — Z0001 Encounter for general adult medical examination with abnormal findings: Secondary | ICD-10-CM | POA: Diagnosis not present

## 2020-07-31 DIAGNOSIS — N529 Male erectile dysfunction, unspecified: Secondary | ICD-10-CM | POA: Diagnosis not present

## 2020-07-31 DIAGNOSIS — E782 Mixed hyperlipidemia: Secondary | ICD-10-CM

## 2020-07-31 DIAGNOSIS — Z0000 Encounter for general adult medical examination without abnormal findings: Secondary | ICD-10-CM | POA: Diagnosis not present

## 2020-07-31 DIAGNOSIS — H9193 Unspecified hearing loss, bilateral: Secondary | ICD-10-CM

## 2020-07-31 DIAGNOSIS — Z125 Encounter for screening for malignant neoplasm of prostate: Secondary | ICD-10-CM

## 2020-07-31 DIAGNOSIS — I10 Essential (primary) hypertension: Secondary | ICD-10-CM

## 2020-07-31 MED ORDER — AMLODIPINE BESYLATE 10 MG PO TABS: 1.0000 | ORAL_TABLET | Freq: Every day | ORAL | 3 refills | Status: DC

## 2020-07-31 MED ORDER — ATORVASTATIN CALCIUM 10 MG PO TABS: 1.0000 | ORAL_TABLET | Freq: Every day | ORAL | 3 refills | Status: DC

## 2020-07-31 NOTE — Progress Notes (Signed)
BP 123/73   Pulse 66   Temp 97.6 F (36.4 C) (Temporal)   Ht '5\' 8"'  (1.727 m)   Wt 225 lb (102.1 kg)   BMI 34.21 kg/m    Subjective:   Patient ID: John Mejia, male    DOB: 05/10/1950, 70 y.o.   MRN: [id number]  HPI: John Mejia is a 70 y.o. male presenting on 07/31/2020 for Annual Exam   HPI Adult well exam and physical Patient denies any chest pain, shortness of breath, headaches or vision issues, abdominal complaints, diarrhea, nausea, vomiting, or joint issues.  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  Hypertension Patient is currently on amlodipine, and their blood pressure today is 123/73. Patient denies any lightheadedness or dizziness. Patient denies headaches, blurred vision, chest pains, shortness of breath, or weakness. Denies any side effects from medication and is content with current medication.   Hyperlipidemia Patient is coming in for recheck of his hyperlipidemia. The patient is currently taking atorvastatin. They deny any issues with myalgias or history of liver damage from it. They deny any focal numbness or weakness or chest pain.   Erectile dysfunction recheck Patient is coming in for erectile dysfunction recheck.  Relevant past medical, surgical, family and social history reviewed and updated as indicated. Interim medical history since our last visit reviewed. Allergies and medications reviewed and updated.  Review of Systems  Constitutional: Negative for chills and fever.  Respiratory: Negative for shortness of breath and wheezing.   Cardiovascular: Negative for chest pain and leg swelling.  Musculoskeletal: Negative for back pain and gait problem.  Skin: Negative for rash.  Neurological: Negative for dizziness, weakness and light-headedness.  All other systems reviewed and are negative.   Per HPI unless specifically indicated above   Allergies as of 07/31/2020   No Known Allergies     Medication  List       Accurate as of Jul 31, 2020 10:08 AM. If you have any questions, ask your nurse or doctor.        STOP taking these medications   meloxicam 15 MG tablet Commonly known as: MOBIC Stopped by: Fransisca Kaufmann Danna Casella, MD   tiZANidine 4 MG tablet Commonly known as: ZANAFLEX Stopped by: Fransisca Kaufmann Naser Schuld, MD     TAKE these medications   amLODipine 10 MG tablet Commonly known as: NORVASC TAKE 1 TABLET BY MOUTH EVERY DAY   aspirin 81 MG EC tablet Take 81 mg by mouth daily. Swallow whole.   atorvastatin 10 MG tablet Commonly known as: LIPITOR Take 1 tablet (10 mg total) by mouth daily.   co-enzyme Q-10 30 MG capsule Take 30 mg by mouth daily.   sildenafil 20 MG tablet Commonly known as: REVATIO TAKE 2-5 TABLETS BY MOUTH AS NEEDED FOR SEXUAL ACTIVITY        Objective:   BP 123/73   Pulse 66   Temp 97.6 F (36.4 C) (Temporal)   Ht '5\' 8"'  (1.727 m)   Wt 225 lb (102.1 kg)   BMI 34.21 kg/m   Wt Readings from Last 3 Encounters:  07/31/20 225 lb (102.1 kg)  06/30/20 223 lb (101.2 kg)  04/24/20 225 lb 4 oz (102.2 kg)    Physical Exam Vitals and nursing note reviewed.  Constitutional:      General: He is not in acute distress.    Appearance: He is well-developed. He is not diaphoretic.  Eyes:     General: No  scleral icterus.    Conjunctiva/sclera: Conjunctivae normal.  Neck:     Thyroid: No thyromegaly.  Cardiovascular:     Rate and Rhythm: Normal rate and regular rhythm.     Heart sounds: Normal heart sounds. No murmur heard.   Pulmonary:     Effort: Pulmonary effort is normal. No respiratory distress.     Breath sounds: Normal breath sounds. No wheezing.  Genitourinary:    Prostate: Not enlarged and no nodules present.     Rectum: External hemorrhoid present. No tenderness, anal fissure or internal hemorrhoid. Normal anal tone.  Musculoskeletal:        General: Normal range of motion.     Cervical back: Neck supple.  Lymphadenopathy:     Cervical:  No cervical adenopathy.  Skin:    General: Skin is warm and dry.     Findings: No rash.  Neurological:     Mental Status: He is alert and oriented to person, place, and time.     Coordination: Coordination normal.  Psychiatric:        Behavior: Behavior normal.       Assessment & Plan:   Problem List Items Addressed This Visit      Cardiovascular and Mediastinum   Hypertension   Relevant Medications   amLODipine (NORVASC) 10 MG tablet   atorvastatin (LIPITOR) 10 MG tablet   Other Relevant Orders   CMP14+EGFR     Other   Hyperlipidemia   Relevant Medications   amLODipine (NORVASC) 10 MG tablet   atorvastatin (LIPITOR) 10 MG tablet   Other Relevant Orders   CMP14+EGFR   Lipid panel   Erectile dysfunction   Relevant Orders   PSA, total and free    Other Visit Diagnoses    Well adult exam    -  Primary   Relevant Orders   CBC with Differential/Platelet   PSA, total and free   Essential hypertension       Relevant Medications   amLODipine (NORVASC) 10 MG tablet   atorvastatin (LIPITOR) 10 MG tablet   Prostate cancer screening       Relevant Orders   PSA, total and free   Bilateral hearing loss, unspecified hearing loss type       Relevant Orders   Ambulatory referral to Audiology      Seems to be doing well, continue current medication, sent referral to audiology. Follow up plan: Return in about 1 year (around 07/31/2021), or if symptoms worsen or fail to improve, for Physical exam and recheck hypertension cholesterol.  Counseling provided for all of the vaccine components No orders of the defined types were placed in this encounter.   Caryl Pina, MD [facility] 07/31/2020, 10:08 AM

## 2020-08-01 LAB — CBC WITH DIFFERENTIAL/PLATELET
Basophils Absolute: 0.1 10*3/uL (ref 0.0–0.2)
Basos: 1 %
EOS (ABSOLUTE): 0.2 10*3/uL (ref 0.0–0.4)
Eos: 4 %
Hematocrit: 41.7 % (ref 37.5–51.0)
Hemoglobin: 14.1 g/dL (ref 13.0–17.7)
Immature Grans (Abs): 0 10*3/uL (ref 0.0–0.1)
Immature Granulocytes: 0 %
Lymphocytes Absolute: 1.5 10*3/uL (ref 0.7–3.1)
Lymphs: 35 %
MCH: 26.9 pg (ref 26.6–33.0)
MCHC: 33.8 g/dL (ref 31.5–35.7)
MCV: 80 fL (ref 79–97)
Monocytes Absolute: 0.4 10*3/uL (ref 0.1–0.9)
Monocytes: 9 %
Neutrophils Absolute: 2.1 10*3/uL (ref 1.4–7.0)
Neutrophils: 51 %
Platelets: 235 10*3/uL (ref 150–450)
RBC: 5.24 x10E6/uL (ref 4.14–5.80)
RDW: 13.1 % (ref 11.6–15.4)
WBC: 4.3 10*3/uL (ref 3.4–10.8)

## 2020-08-01 LAB — CMP14+EGFR
ALT: 21 IU/L (ref 0–44)
AST: 20 IU/L (ref 0–40)
Albumin/Globulin Ratio: 2 (ref 1.2–2.2)
Albumin: 4.8 g/dL (ref 3.8–4.8)
Alkaline Phosphatase: 88 IU/L (ref 44–121)
BUN/Creatinine Ratio: 9 — ABNORMAL LOW (ref 10–24)
BUN: 9 mg/dL (ref 8–27)
Bilirubin Total: 0.4 mg/dL (ref 0.0–1.2)
CO2: 22 mmol/L (ref 20–29)
Calcium: 11.1 mg/dL — ABNORMAL HIGH (ref 8.6–10.2)
Chloride: 107 mmol/L — ABNORMAL HIGH (ref 96–106)
Creatinine, Ser: 1.01 mg/dL (ref 0.76–1.27)
Globulin, Total: 2.4 g/dL (ref 1.5–4.5)
Glucose: 117 mg/dL — ABNORMAL HIGH (ref 65–99)
Potassium: 4.5 mmol/L (ref 3.5–5.2)
Sodium: 142 mmol/L (ref 134–144)
Total Protein: 7.2 g/dL (ref 6.0–8.5)
eGFR: 81 mL/min/{1.73_m2} (ref >59)

## 2020-08-01 LAB — LIPID PANEL
Chol/HDL Ratio: 2.3 ratio (ref 0.0–5.0)
Cholesterol, Total: 118 mg/dL (ref 100–199)
HDL: 51 mg/dL (ref >39)
LDL Chol Calc (NIH): 55 mg/dL (ref 0–99)
Triglycerides: 53 mg/dL (ref 0–149)
VLDL Cholesterol Cal: 12 mg/dL (ref 5–40)

## 2020-08-01 LAB — PSA, TOTAL AND FREE
PSA, Free Pct: 33.8 %
PSA, Free: 0.27 ng/mL
Prostate Specific Ag, Serum: 0.8 ng/mL (ref 0.0–4.0)

## 2020-08-03 ENCOUNTER — Other Ambulatory Visit: Payer: Self-pay | Admitting: Family Medicine

## 2020-08-03 ENCOUNTER — Telehealth: Payer: Self-pay

## 2020-08-03 ENCOUNTER — Other Ambulatory Visit: Payer: Self-pay

## 2020-08-03 DIAGNOSIS — N529 Male erectile dysfunction, unspecified: Secondary | ICD-10-CM

## 2020-08-03 MED ORDER — SILDENAFIL CITRATE 20 MG PO TABS: ORAL_TABLET | ORAL | 1 refills | Status: DC

## 2020-12-05 ENCOUNTER — Other Ambulatory Visit: Payer: Self-pay | Admitting: Family Medicine

## 2020-12-05 DIAGNOSIS — N529 Male erectile dysfunction, unspecified: Secondary | ICD-10-CM

## 2021-02-14 ENCOUNTER — Other Ambulatory Visit: Payer: Self-pay | Admitting: Family Medicine

## 2021-02-14 DIAGNOSIS — N529 Male erectile dysfunction, unspecified: Secondary | ICD-10-CM

## 2021-04-25 ENCOUNTER — Other Ambulatory Visit: Payer: Self-pay | Admitting: Family Medicine

## 2021-04-25 DIAGNOSIS — N529 Male erectile dysfunction, unspecified: Secondary | ICD-10-CM

## 2021-04-25 NOTE — Telephone Encounter (Signed)
Last office visit 07/31/20 Last refill 02/19/21, #90, no refills

## 2021-06-25 ENCOUNTER — Other Ambulatory Visit: Payer: Self-pay | Admitting: Family Medicine

## 2021-06-25 DIAGNOSIS — N529 Male erectile dysfunction, unspecified: Secondary | ICD-10-CM

## 2021-07-03 ENCOUNTER — Ambulatory Visit (INDEPENDENT_AMBULATORY_CARE_PROVIDER_SITE_OTHER): Payer: Medicare Other

## 2021-07-03 VITALS — Wt 225.0 lb

## 2021-07-03 DIAGNOSIS — Z0000 Encounter for general adult medical examination without abnormal findings: Secondary | ICD-10-CM | POA: Diagnosis not present

## 2021-07-03 NOTE — Progress Notes (Signed)
? ?Subjective:  ? [facility] S[facility] Caskey is a 71 y.o. male who presents for Medicare Annual/Subsequent preventive examination. ? ?Virtual Visit via Telephone Note ? ?I connected with  [facility] on 07/03/21 at  1:15 PM EDT by telephone and verified that I am speaking with the correct person using two identifiers. ? ?Location: ?Patient: Home ?Provider: [facility] ?Persons participating in the virtual visit: patient/Nurse Health Advisor ?  ?I discussed the limitations, risks, security and privacy concerns of performing an evaluation and management service by telephone and the availability of in person appointments. The patient expressed understanding and agreed to proceed. ? ?Interactive audio and video telecommunications were attempted between this nurse and patient, however failed, due to patient having technical difficulties OR patient did not have access to video capability.  We continued and completed visit with audio only. ? ?Some vital signs may be absent or patient reported.  ? ?Hortensia Duffin Hurman HornE Lonnette Shrode, LPN  ? ?Review of Systems    ? ?Cardiac Risk Factors include: advanced age (>7555men, 44>65 women);dyslipidemia;hypertension;male gender;obesity (BMI >30kg/m2);sedentary lifestyle ? ?   ?Objective:  ?  ?Today's Vitals  ? 07/03/21 1319  ?Weight: 225 lb (102.1 kg)  ? ?Body mass index is 34.21 kg/m?. ? ? ?  07/03/2021  ?  1:23 PM 06/30/2019  ? 10:39 AM 04/16/2018  ?  9:37 AM 04/09/2017  ? 11:44 AM  ?Advanced Directives  ?Does Patient Have a Medical Advance Directive? Yes Yes Yes Yes  ?Type of Estate agentAdvance Directive Healthcare Power of TalpaAttorney;Living will Healthcare Power of eBayttorney Healthcare Power of GypsyAttorney;Living will Healthcare Power of Attorney  ?Does patient want to make changes to medical advance directive?  No - Patient declined    ?Copy of Healthcare Power of Attorney in Chart? Yes - validated most recent copy scanned in chart (See row information)  Yes - validated most recent copy scanned in chart (See row information) Yes   ? ? ?Current Medications (verified) ?Outpatient Encounter Medications as of 07/03/2021  ?Medication Sig  ? amLODipine (NORVASC) 10 MG tablet Take 1 tablet (10 mg total) by mouth daily.  ? aspirin 81 MG EC tablet Take 81 mg by mouth daily. Swallow whole.  ? atorvastatin (LIPITOR) 10 MG tablet Take 1 tablet (10 mg total) by mouth daily.  ? co-enzyme Q-10 30 MG capsule Take 30 mg by mouth daily.  ? sildenafil (REVATIO) 20 MG tablet TAKW 2 TO 5 TABLETS BY MOUTH AS NEEDED FOR SEXUAL ACTIVITY  ? ?No facility-administered encounter medications on file as of 07/03/2021.  ? ? ?Allergies (verified) ?Patient has no known allergies.  ? ?History: ?Past Medical History:  ?Diagnosis Date  ? Asthma   ? Cataract   ? Eczema   ? Erectile dysfunction   ? Hyperlipidemia   ? Hypertension   ? Multiple allergies   ? Right-sided carotid artery disease (HCC)   ? ?Past Surgical History:  ?Procedure Laterality Date  ? COLONOSCOPY    ? 2009  ? VASECTOMY    ? ?Family History  ?Problem Relation Age of Onset  ? Hypertension Father   ? Stroke Father [id number]  ? Heart disease Father   ?     "Enlarged heart"  ? Hypertension Mother   ? Cancer Sister   ?     ovarian cancer / fibroid tumors  ? ?Social History  ? ?Socioeconomic History  ? Marital status: Married  ?  Spouse name: Hilda LiasMarie  ? Number of children: 0  ? Years of education: some trade school  ?  Highest education level: Some college, no degree  ?Occupational History  ? Occupation: farmer  ?  Comment: Currently farming part time  ? Occupation: Chartered certified accountant  ?  Comment: retired  ?Tobacco Use  ? Smoking status: Former  ?  Packs/day: 0.10  ?  Years: 4.00  ?  Pack years: 0.40  ?  Types: Cigarettes  ?  Quit date: 03/25/1978  ?  Years since quitting: 43.3  ? Smokeless tobacco: Never  ? Tobacco comments:  ?  Only smoked socially   ?Vaping Use  ? Vaping Use: Never used  ?Substance and Sexual Activity  ? Alcohol use: Not Currently  ? Drug use: No  ? Sexual activity: Yes  ?Other Topics Concern  ? Not on file  ?Social  History Narrative  ? Patient is retired and lives at home with his wife. They do not have any children.   ? ?Social Determinants of Health  ? ?Financial Resource Strain: Low Risk   ? Difficulty of Paying Living Expenses: Not hard at all  ?Food Insecurity: No Food Insecurity  ? Worried About Programme researcher, broadcasting/film/video in the Last Year: Never true  ? Ran Out of Food in the Last Year: Never true  ?Transportation Needs: No Transportation Needs  ? Lack of Transportation (Medical): No  ? Lack of Transportation (Non-Medical): No  ?Physical Activity: Insufficiently Active  ? Days of Exercise per Week: 7 days  ? Minutes of Exercise per Session: 20 min  ?Stress: No Stress Concern Present  ? Feeling of Stress : Not at all  ?Social Connections: Socially Integrated  ? Frequency of Communication with Friends and Family: More than three times a week  ? Frequency of Social Gatherings with Friends and Family: More than three times a week  ? Attends Religious Services: More than 4 times per year  ? Active Member of Clubs or Organizations: Yes  ? Attends Banker Meetings: More than 4 times per year  ? Marital Status: Married  ? ? ?Tobacco Counseling ?Counseling given: Not Answered ?Tobacco comments: Only smoked socially  ? ? ?Clinical Intake: ? ?Pre-visit preparation completed: Yes ? ?Pain : No/denies pain ? ?  ? ?BMI - recorded: 34.21 ?Nutritional Status: BMI > 30  Obese ?Nutritional Risks: None ?Diabetes: No ? ?How often do you need to have someone help you when you read instructions, pamphlets, or other written materials from your doctor or pharmacy?: 1 - Never ? ?Diabetic? no ? ?Interpreter Needed?: No ? ?Information entered by :: Dierra Riesgo, LPN ? ? ?Activities of Daily Living ? ?  07/03/2021  ?  1:24 PM  ?In your present state of health, do you have any difficulty performing the following activities:  ?Hearing? 1  ?Comment wears hearing aids  ?Vision? 0  ?Difficulty concentrating or making decisions? 0  ?Walking or  climbing stairs? 0  ?Dressing or bathing? 0  ?Doing errands, shopping? 0  ?Preparing Food and eating ? N  ?Using the Toilet? N  ?In the past six months, have you accidently leaked urine? Y  ?Comment enlarged prostate - mild  ?Do you have problems with loss of bowel control? N  ?Managing your Medications? N  ?Managing your Finances? N  ?Housekeeping or managing your Housekeeping? N  ? ? ?Patient Care Team: ?Dettinger, Elige Radon, MD as PCP - General (Family Medicine) ?Rollene Rotunda, MD as Consulting Physician (Cardiology) ? ?Indicate any recent Medical Services you may have received from other than [facility] providers in the past year (date  may be approximate). ? ?   ?Assessment:  ? This is a routine wellness examination for [facility]. ? ?Hearing/Vision screen ?Hearing Screening - Comments:: Wears hearing aids sometimes - they don't help a lot ?Vision Screening - Comments:: Wears rx glasses - up to date with routine eye exams with Dr Conni Elliot in [place], [place] ? ?Dietary issues and exercise activities discussed: ?Current Exercise Habits: Home exercise routine, Type of exercise: walking;Other - see comments (yard work, farming, working on cars), Time (Minutes): 20, Frequency (Times/Week): 7, Weekly Exercise (Minutes/Week): 140, Intensity: Mild, Exercise limited by: None identified ? ? Goals Addressed   ? ?  ?  ?  ?  ? This Visit's Progress  ?  Exercise 150 min/wk Moderate Activity   On track  ?  Using your total gym is a great option. ?  ? ?  ? ?Depression Screen ? ?  07/03/2021  ?  1:22 PM 07/31/2020  ? 10:02 AM 06/30/2020  ? 12:56 PM 04/24/2020  ? 11:45 AM 01/31/2020  ?  8:04 AM 07/28/2019  ?  2:26 PM 06/30/2019  ? 10:39 AM  ?PHQ 2/9 Scores  ?PHQ - 2 Score 0 0 0 0 0 0 0  ?  ?Fall Risk ? ?  07/03/2021  ?  1:21 PM 07/31/2020  ? 10:01 AM 06/30/2020  ?  1:03 PM 04/24/2020  ? 11:45 AM 01/31/2020  ?  8:04 AM  ?Fall Risk   ?Falls in the past year? 0 0 0 0 0  ?Number falls in past yr: 0  0    ?Injury with Fall? 0  0    ?Risk for fall due to : No Fall Risks   No Fall Risks    ?Follow up Falls prevention discussed  Falls prevention discussed    ? ? ?FALL RISK PREVENTION PERTAINING TO THE HOME: ? ?Any stairs in or around the home? Yes  ?If so, are there any without handrails? No

## 2021-07-03 NOTE — Patient Instructions (Signed)
John Mejia , ?Thank you for taking time to come for your Medicare Wellness Visit. I appreciate your ongoing commitment to your health goals. Please review the following plan we discussed and let me know if I can assist you in the future.  ? ?Screening recommendations/referrals: ?Colonoscopy: Done 08/11/2017 - Repeat in 10 years ?Recommended yearly ophthalmology/optometry visit for glaucoma screening and checkup ?Recommended yearly dental visit for hygiene and checkup ? ?Vaccinations: ?Influenza vaccine: done 01/22/2021 - Repeat annually ?Pneumococcal vaccine: Done 04/08/2016 & 05/12/2017 ?Tdap vaccine: Done 11/07/2014 - Repeat in 10 years ?Shingles vaccine: Due* - Shingrix is 2 doses 2-6 months apart and over 90% effective     ?Covid-19: Done  05/20/2019, 06/15/2019, 08/16/2020, & 01/22/2021 ? ?Advanced directives: in chart ? ?Conditions/risks identified:  Keep up the great work! Aim for 30 minutes of exercise or brisk walking, 6-8 glasses of water, and 5 servings of fruits and vegetables each day.  ? ?Next appointment: Follow up in one year for your annual wellness visit.  ? ?Preventive Care 30 Years and Older, Male ? ?Preventive care refers to lifestyle choices and visits with your health care provider that can promote health and wellness. ?What does preventive care include? ?A yearly physical exam. This is also called an annual well check. ?Dental exams once or twice a year. ?Routine eye exams. Ask your health care provider how often you should have your eyes checked. ?Personal lifestyle choices, including: ?Daily care of your teeth and gums. ?Regular physical activity. ?Eating a healthy diet. ?Avoiding tobacco and drug use. ?Limiting alcohol use. ?Practicing safe sex. ?Taking low doses of aspirin every day. ?Taking vitamin and mineral supplements as recommended by your health care provider. ?What happens during an annual well check? ?The services and screenings done by your health care provider during your annual well  check will depend on your age, overall health, lifestyle risk factors, and family history of disease. ?Counseling  ?Your health care provider may ask you questions about your: ?Alcohol use. ?Tobacco use. ?Drug use. ?Emotional well-being. ?Home and relationship well-being. ?Sexual activity. ?Eating habits. ?History of falls. ?Memory and ability to understand (cognition). ?Work and work Astronomer. ?Screening  ?You may have the following tests or measurements: ?Height, weight, and BMI. ?Blood pressure. ?Lipid and cholesterol levels. These may be checked every 5 years, or more frequently if you are over 71 years old. ?Skin check. ?Lung cancer screening. You may have this screening every year starting at age 75 if you have a 30-pack-year history of smoking and currently smoke or have quit within the past 15 years. ?Fecal occult blood test (FOBT) of the stool. You may have this test every year starting at age 49. ?Flexible sigmoidoscopy or colonoscopy. You may have a sigmoidoscopy every 5 years or a colonoscopy every 10 years starting at age 68. ?Prostate cancer screening. Recommendations will vary depending on your family history and other risks. ?Hepatitis C blood test. ?Hepatitis B blood test. ?Sexually transmitted disease (STD) testing. ?Diabetes screening. This is done by checking your blood sugar (glucose) after you have not eaten for a while (fasting). You may have this done every 1-3 years. ?Abdominal aortic aneurysm (AAA) screening. You may need this if you are a current or former smoker. ?Osteoporosis. You may be screened starting at age 9 if you are at high risk. ?Talk with your health care provider about your test results, treatment options, and if necessary, the need for more tests. ?Vaccines  ?Your health care provider may recommend certain vaccines, such  as: ?Influenza vaccine. This is recommended every year. ?Tetanus, diphtheria, and acellular pertussis (Tdap, Td) vaccine. You may need a Td booster  every 10 years. ?Zoster vaccine. You may need this after age 74. ?Pneumococcal 13-valent conjugate (PCV13) vaccine. One dose is recommended after age 68. ?Pneumococcal polysaccharide (PPSV23) vaccine. One dose is recommended after age 34. ?Talk to your health care provider about which screenings and vaccines you need and how often you need them. ?This information is not intended to replace advice given to you by your health care provider. Make sure you discuss any questions you have with your health care provider. ?Document Released: 04/07/2015 Document Revised: 11/29/2015 Document Reviewed: 01/10/2015 ?Elsevier Interactive Patient Education ? 2017 [place]. ? ?Fall Prevention in the Home ?Falls can cause injuries. They can happen to people of all ages. There are many things you can do to make your home safe and to help prevent falls. ?What can I do on the outside of my home? ?Regularly fix the edges of walkways and driveways and fix any cracks. ?Remove anything that might make you trip as you walk through a door, such as a raised step or threshold. ?Trim any bushes or trees on the path to your home. ?Use bright outdoor lighting. ?Clear any walking paths of anything that might make someone trip, such as rocks or tools. ?Regularly check to see if handrails are loose or broken. Make sure that both sides of any steps have handrails. ?Any raised decks and porches should have guardrails on the edges. ?Have any leaves, snow, or ice cleared regularly. ?Use sand or salt on walking paths during winter. ?Clean up any spills in your garage right away. This includes oil or grease spills. ?What can I do in the bathroom? ?Use night lights. ?Install grab bars by the toilet and in the tub and shower. Do not use towel bars as grab bars. ?Use non-skid mats or decals in the tub or shower. ?If you need to sit down in the shower, use a plastic, non-slip stool. ?Keep the floor dry. Clean up any water that spills on the floor as soon  as it happens. ?Remove soap buildup in the tub or shower regularly. ?Attach bath mats securely with double-sided non-slip rug tape. ?Do not have throw rugs and other things on the floor that can make you trip. ?What can I do in the bedroom? ?Use night lights. ?Make sure that you have a light by your bed that is easy to reach. ?Do not use any sheets or blankets that are too big for your bed. They should not hang down onto the floor. ?Have a firm chair that has side arms. You can use this for support while you get dressed. ?Do not have throw rugs and other things on the floor that can make you trip. ?What can I do in the kitchen? ?Clean up any spills right away. ?Avoid walking on wet floors. ?Keep items that you use a lot in easy-to-reach places. ?If you need to reach something above you, use a strong step stool that has a grab bar. ?Keep electrical cords out of the way. ?Do not use floor polish or wax that makes floors slippery. If you must use wax, use non-skid floor wax. ?Do not have throw rugs and other things on the floor that can make you trip. ?What can I do with my stairs? ?Do not leave any items on the stairs. ?Make sure that there are handrails on both sides of the stairs and use  them. Fix handrails that are broken or loose. Make sure that handrails are as long as the stairways. ?Check any carpeting to make sure that it is firmly attached to the stairs. Fix any carpet that is loose or worn. ?Avoid having throw rugs at the top or bottom of the stairs. If you do have throw rugs, attach them to the floor with carpet tape. ?Make sure that you have a light switch at the top of the stairs and the bottom of the stairs. If you do not have them, ask someone to add them for you. ?What else can I do to help prevent falls? ?Wear shoes that: ?Do not have high heels. ?Have rubber bottoms. ?Are comfortable and fit you well. ?Are closed at the toe. Do not wear sandals. ?If you use a stepladder: ?Make sure that it is fully  opened. Do not climb a closed stepladder. ?Make sure that both sides of the stepladder are locked into place. ?Ask someone to hold it for you, if possible. ?Clearly mark and make sure that you can see: ?Any

## 2021-08-01 ENCOUNTER — Ambulatory Visit (INDEPENDENT_AMBULATORY_CARE_PROVIDER_SITE_OTHER): Payer: Medicare Other | Admitting: Family Medicine

## 2021-08-01 ENCOUNTER — Encounter: Payer: Self-pay | Admitting: Family Medicine

## 2021-08-01 VITALS — BP 127/73 | HR 72 | Ht 68.0 in | Wt 229.0 lb

## 2021-08-01 DIAGNOSIS — Z23 Encounter for immunization: Secondary | ICD-10-CM

## 2021-08-01 DIAGNOSIS — I10 Essential (primary) hypertension: Secondary | ICD-10-CM

## 2021-08-01 DIAGNOSIS — Z0001 Encounter for general adult medical examination with abnormal findings: Secondary | ICD-10-CM | POA: Diagnosis not present

## 2021-08-01 DIAGNOSIS — N529 Male erectile dysfunction, unspecified: Secondary | ICD-10-CM

## 2021-08-01 DIAGNOSIS — E782 Mixed hyperlipidemia: Secondary | ICD-10-CM

## 2021-08-01 DIAGNOSIS — Z0000 Encounter for general adult medical examination without abnormal findings: Secondary | ICD-10-CM

## 2021-08-01 DIAGNOSIS — Z125 Encounter for screening for malignant neoplasm of prostate: Secondary | ICD-10-CM

## 2021-08-01 MED ORDER — SILDENAFIL CITRATE 20 MG PO TABS: ORAL_TABLET | ORAL | 7 refills | Status: DC

## 2021-08-01 MED ORDER — AMLODIPINE BESYLATE 10 MG PO TABS: 10.0000 mg | ORAL_TABLET | Freq: Every day | ORAL | 3 refills | Status: DC

## 2021-08-01 MED ORDER — ATORVASTATIN CALCIUM 10 MG PO TABS: 10.0000 mg | ORAL_TABLET | Freq: Every day | ORAL | 3 refills | Status: DC

## 2021-08-01 NOTE — Progress Notes (Signed)
? ?BP 127/73   Pulse 72   Ht 5' 8" (1.727 m)   Wt 229 lb (103.9 kg)   SpO2 100%   BMI 34.82 kg/m?   ? ?Subjective:  ? ?Patient ID: John Mejia, male    DOB: 06/05/1950, 71 y.o.   MRN: [id number] ? ?HPI: ?John Mejia is a 71 y.o. male presenting on 08/01/2021 for Medical Management of Chronic Issues (CPE) ? ? ?HPI ?Physical exam ?Patient denies any chest pain, shortness of breath, headaches or vision issues, abdominal complaints, diarrhea, nausea, vomiting, or joint issues.  Patient has a little bit of congestion and ear pressure in his right ear and wants that looked at.  He thinks it is from all the pollen.  He did use Flonase once or twice but has not used it consistently at this point. ? ?Erectile dysfunction ?Uses sildenafil for erectile dysfunction, needs refill. ? ?Hypertension ?Patient is currently on amlodipine, and their blood pressure today is 127/73. Patient denies any lightheadedness or dizziness. Patient denies headaches, blurred vision, chest pains, shortness of breath, or weakness. Denies any side effects from medication and is content with current medication.  ? ?Hyperlipidemia ?Patient is coming in for recheck of his hyperlipidemia. The patient is currently taking atorvastatin. They deny any issues with myalgias or history of liver damage from it. They deny any focal numbness or weakness or chest pain.  ? ?Relevant past medical, surgical, family and social history reviewed and updated as indicated. Interim medical history since our last visit reviewed. ?Allergies and medications reviewed and updated. ? ?Review of Systems  ?Constitutional:  Negative for chills and fever.  ?Eyes:  Negative for visual disturbance.  ?Respiratory:  Negative for shortness of breath and wheezing.   ?Cardiovascular:  Negative for chest pain and leg swelling.  ?Musculoskeletal:  Negative for back pain and gait problem.  ?Skin:  Negative for rash.  ?Neurological:  Negative for dizziness, weakness and light-headedness.   ?All other systems reviewed and are negative. ? ?Per HPI unless specifically indicated above ? ? ?Allergies as of 08/01/2021   ?No Known Allergies ?  ? ?  ?Medication List  ?  ? ?  ? Accurate as of Aug 01, 2021 10:04 AM. If you have any questions, ask your nurse or doctor.  ?  ?  ? ?  ? ?amLODipine 10 MG tablet ?Commonly known as: NORVASC ?Take 1 tablet (10 mg total) by mouth daily. ?  ?aspirin 81 MG EC tablet ?Take 81 mg by mouth daily. Swallow whole. ?  ?atorvastatin 10 MG tablet ?Commonly known as: LIPITOR ?Take 1 tablet (10 mg total) by mouth daily. ?  ?co-enzyme Q-10 30 MG capsule ?Take 30 mg by mouth daily. ?  ?PROSTATE HEALTH PO ?Take by mouth. ?  ?sildenafil 20 MG tablet ?Commonly known as: REVATIO ?TAKW 2 TO 5 TABLETS BY MOUTH AS NEEDED FOR SEXUAL ACTIVITY ?  ? ?  ? ? ? ?Objective:  ? ?BP 127/73   Pulse 72   Ht 5' 8" (1.727 m)   Wt 229 lb (103.9 kg)   SpO2 100%   BMI 34.82 kg/m?   ?Wt Readings from Last 3 Encounters:  ?08/01/21 229 lb (103.9 kg)  ?07/03/21 225 lb (102.1 kg)  ?07/31/20 225 lb (102.1 kg)  ?  ?Physical Exam ?Vitals and nursing note reviewed.  ?Constitutional:   ?   General: He is not in acute distress. ?   Appearance: He is well-developed. He is not diaphoretic.  ?HENT:  ?     Right Ear: External ear normal.  ?   Left Ear: External ear normal.  ?   Nose: Nose normal.  ?   Mouth/Throat:  ?   Pharynx: No oropharyngeal exudate.  ?Eyes:  ?   General: No scleral icterus.    ?   Right eye: No discharge.  ?   Conjunctiva/sclera: Conjunctivae normal.  ?   Pupils: Pupils are equal, round, and reactive to light.  ?Neck:  ?   Thyroid: No thyromegaly.  ?Cardiovascular:  ?   Rate and Rhythm: Normal rate and regular rhythm.  ?   Heart sounds: Normal heart sounds. No murmur heard. ?Pulmonary:  ?   Effort: Pulmonary effort is normal. No respiratory distress.  ?   Breath sounds: Normal breath sounds. No wheezing.  ?Abdominal:  ?   General: Abdomen is flat. Bowel sounds are normal. There is no distension.  ?    Palpations: Abdomen is soft.  ?   Tenderness: There is no abdominal tenderness. There is no guarding or rebound.  ?Musculoskeletal:     ?   General: No swelling. Normal range of motion.  ?   Cervical back: Neck supple.  ?Lymphadenopathy:  ?   Cervical: No cervical adenopathy.  ?Skin: ?   General: Skin is warm and dry.  ?   Findings: No rash.  ?Neurological:  ?   Mental Status: He is alert and oriented to person, place, and time.  ?   Coordination: Coordination normal.  ?Psychiatric:     ?   Behavior: Behavior normal.  ? ? ? ? ?Assessment & Plan:  ? ?Problem List Items Addressed This Visit   ? ?  ? Cardiovascular and Mediastinum  ? Hypertension  ? Relevant Medications  ? amLODipine (NORVASC) 10 MG tablet  ? atorvastatin (LIPITOR) 10 MG tablet  ? sildenafil (REVATIO) 20 MG tablet  ? Other Relevant Orders  ? CBC with Differential/Platelet  ?  ? Other  ? Hyperlipidemia  ? Relevant Medications  ? amLODipine (NORVASC) 10 MG tablet  ? atorvastatin (LIPITOR) 10 MG tablet  ? sildenafil (REVATIO) 20 MG tablet  ? Other Relevant Orders  ? CMP14+EGFR  ? Lipid panel  ? Erectile dysfunction  ? Relevant Medications  ? sildenafil (REVATIO) 20 MG tablet  ? ?Other Visit Diagnoses   ? ? Well adult exam    -  Primary  ? Essential hypertension      ? Relevant Medications  ? amLODipine (NORVASC) 10 MG tablet  ? atorvastatin (LIPITOR) 10 MG tablet  ? sildenafil (REVATIO) 20 MG tablet  ? Prostate cancer screening      ? Relevant Orders  ? PSA, total and free  ? ?  ?  ?Continue current medicine, sent refills for him.  No changes, follow-up in 1 year ?Follow up plan: ?Return in about 1 year (around 08/02/2022), or if symptoms worsen or fail to improve, for Physical and hypertension and cholesterol. ? ?Counseling provided for all of the vaccine components ?Orders Placed This Encounter  ?Procedures  ? CBC with Differential/Platelet  ? CMP14+EGFR  ? Lipid panel  ? PSA, total and free  ? ? ?Joshua Dettinger, MD ?[facility] ?08/01/2021, 10:04 AM ? ? ? ? ?

## 2021-08-01 NOTE — Addendum Note (Signed)
Addended by: Dorene Sorrow on: 08/01/2021 10:11 AM ? ? Modules accepted: Orders ? ?

## 2021-08-02 LAB — CBC WITH DIFFERENTIAL/PLATELET
Basophils Absolute: 0 10*3/uL (ref 0.0–0.2)
Basos: 1 %
EOS (ABSOLUTE): 0.2 10*3/uL (ref 0.0–0.4)
Eos: 4 %
Hematocrit: 42.5 % (ref 37.5–51.0)
Hemoglobin: 14.3 g/dL (ref 13.0–17.7)
Immature Grans (Abs): 0 10*3/uL (ref 0.0–0.1)
Immature Granulocytes: 0 %
Lymphocytes Absolute: 1.1 10*3/uL (ref 0.7–3.1)
Lymphs: 25 %
MCH: 26.6 pg (ref 26.6–33.0)
MCHC: 33.6 g/dL (ref 31.5–35.7)
MCV: 79 fL (ref 79–97)
Monocytes Absolute: 0.4 10*3/uL (ref 0.1–0.9)
Monocytes: 9 %
Neutrophils Absolute: 2.8 10*3/uL (ref 1.4–7.0)
Neutrophils: 61 %
Platelets: 224 10*3/uL (ref 150–450)
RBC: 5.37 x10E6/uL (ref 4.14–5.80)
RDW: 13.2 % (ref 11.6–15.4)
WBC: 4.6 10*3/uL (ref 3.4–10.8)

## 2021-08-02 LAB — LIPID PANEL
Chol/HDL Ratio: 2.3 ratio (ref 0.0–5.0)
Cholesterol, Total: 120 mg/dL (ref 100–199)
HDL: 52 mg/dL (ref >39)
LDL Chol Calc (NIH): 55 mg/dL (ref 0–99)
Triglycerides: 57 mg/dL (ref 0–149)
VLDL Cholesterol Cal: 13 mg/dL (ref 5–40)

## 2021-08-02 LAB — CMP14+EGFR
ALT: 20 IU/L (ref 0–44)
AST: 22 IU/L (ref 0–40)
Albumin/Globulin Ratio: 2.2 (ref 1.2–2.2)
Albumin: 4.7 g/dL (ref 3.8–4.8)
Alkaline Phosphatase: 94 IU/L (ref 44–121)
BUN/Creatinine Ratio: 9 — ABNORMAL LOW (ref 10–24)
BUN: 9 mg/dL (ref 8–27)
Bilirubin Total: 0.6 mg/dL (ref 0.0–1.2)
CO2: 23 mmol/L (ref 20–29)
Calcium: 11.2 mg/dL — ABNORMAL HIGH (ref 8.6–10.2)
Chloride: 109 mmol/L — ABNORMAL HIGH (ref 96–106)
Creatinine, Ser: 1.02 mg/dL (ref 0.76–1.27)
Globulin, Total: 2.1 g/dL (ref 1.5–4.5)
Glucose: 120 mg/dL — ABNORMAL HIGH (ref 70–99)
Potassium: 4.6 mmol/L (ref 3.5–5.2)
Sodium: 143 mmol/L (ref 134–144)
Total Protein: 6.8 g/dL (ref 6.0–8.5)
eGFR: 79 mL/min/{1.73_m2} (ref >59)

## 2021-08-02 LAB — PSA, TOTAL AND FREE
PSA, Free Pct: 24.2 %
PSA, Free: 0.29 ng/mL
Prostate Specific Ag, Serum: 1.2 ng/mL (ref 0.0–4.0)

## 2021-08-15 ENCOUNTER — Telehealth: Payer: Self-pay | Admitting: Family Medicine

## 2021-08-15 NOTE — Telephone Encounter (Signed)
Patient calling to discuss lab results from 5/10 with nurse. Please call back.

## 2021-08-24 ENCOUNTER — Other Ambulatory Visit: Payer: Self-pay | Admitting: Family Medicine

## 2021-08-24 ENCOUNTER — Telehealth: Payer: Self-pay | Admitting: Family Medicine

## 2021-08-24 DIAGNOSIS — N529 Male erectile dysfunction, unspecified: Secondary | ICD-10-CM

## 2021-08-24 NOTE — Telephone Encounter (Signed)
Pt called to let us know that his Sildenafil Rx was sent to the wrong pharmacy.  Needs to be sent to [facility] in [place].

## 2021-08-27 ENCOUNTER — Other Ambulatory Visit: Payer: Self-pay

## 2021-08-27 DIAGNOSIS — N529 Male erectile dysfunction, unspecified: Secondary | ICD-10-CM

## 2021-08-27 MED ORDER — SILDENAFIL CITRATE 20 MG PO TABS: ORAL_TABLET | ORAL | 7 refills | Status: DC

## 2021-08-27 NOTE — Telephone Encounter (Signed)
Refills changes to [facility]

## 2021-11-01 ENCOUNTER — Ambulatory Visit (INDEPENDENT_AMBULATORY_CARE_PROVIDER_SITE_OTHER): Payer: Medicare Other

## 2021-11-01 DIAGNOSIS — Z23 Encounter for immunization: Secondary | ICD-10-CM

## 2022-05-29 ENCOUNTER — Encounter: Payer: Self-pay | Admitting: *Deleted

## 2022-07-28 ENCOUNTER — Other Ambulatory Visit: Payer: Self-pay | Admitting: Family Medicine

## 2022-07-28 DIAGNOSIS — I10 Essential (primary) hypertension: Secondary | ICD-10-CM

## 2022-08-05 ENCOUNTER — Encounter: Payer: Medicare Other | Admitting: Family Medicine

## 2022-08-06 ENCOUNTER — Telehealth: Payer: Self-pay | Admitting: Family Medicine

## 2022-08-06 NOTE — Telephone Encounter (Signed)
Contacted [employer] to schedule their annual wellness visit. Patient declined to schedule AWV at this time.  Per 07/03/22 cancellation note  Thank you,  Judeth Cornfield,  AMB Clinical Support [facility] AWV Program Direct Dial ??[phone]

## 2022-08-13 ENCOUNTER — Other Ambulatory Visit: Payer: Self-pay | Admitting: Family Medicine

## 2022-08-13 DIAGNOSIS — E782 Mixed hyperlipidemia: Secondary | ICD-10-CM

## 2022-09-04 ENCOUNTER — Ambulatory Visit (INDEPENDENT_AMBULATORY_CARE_PROVIDER_SITE_OTHER): Payer: Medicare Other | Admitting: Family Medicine

## 2022-09-04 ENCOUNTER — Encounter: Payer: Self-pay | Admitting: Family Medicine

## 2022-09-04 VITALS — BP 136/86 | HR 58 | Ht 68.0 in | Wt 228.0 lb

## 2022-09-04 DIAGNOSIS — Z0000 Encounter for general adult medical examination without abnormal findings: Secondary | ICD-10-CM | POA: Diagnosis not present

## 2022-09-04 DIAGNOSIS — R350 Frequency of micturition: Secondary | ICD-10-CM | POA: Diagnosis not present

## 2022-09-04 DIAGNOSIS — N401 Enlarged prostate with lower urinary tract symptoms: Secondary | ICD-10-CM

## 2022-09-04 DIAGNOSIS — R739 Hyperglycemia, unspecified: Secondary | ICD-10-CM

## 2022-09-04 DIAGNOSIS — N529 Male erectile dysfunction, unspecified: Secondary | ICD-10-CM | POA: Diagnosis not present

## 2022-09-04 DIAGNOSIS — E782 Mixed hyperlipidemia: Secondary | ICD-10-CM

## 2022-09-04 DIAGNOSIS — I10 Essential (primary) hypertension: Secondary | ICD-10-CM

## 2022-09-04 DIAGNOSIS — Z0001 Encounter for general adult medical examination with abnormal findings: Secondary | ICD-10-CM

## 2022-09-04 LAB — BAYER DCA HB A1C WAIVED: HB A1C (BAYER DCA - WAIVED): 5.5 % (ref 4.8–5.6)

## 2022-09-04 MED ORDER — SILDENAFIL CITRATE 20 MG PO TABS: ORAL_TABLET | ORAL | 0 refills | Status: DC

## 2022-09-04 MED ORDER — ATORVASTATIN CALCIUM 10 MG PO TABS: 10.0000 mg | ORAL_TABLET | Freq: Every day | ORAL | 3 refills | Status: DC

## 2022-09-04 MED ORDER — AMLODIPINE BESYLATE 10 MG PO TABS: 10.0000 mg | ORAL_TABLET | Freq: Every day | ORAL | 3 refills | Status: DC

## 2022-09-04 NOTE — Progress Notes (Signed)
BP 136/86   Pulse (!) 58   Ht 5\' 8"  (1.727 m)   Wt 228 lb (103.4 kg)   SpO2 98%   BMI 34.67 kg/m    Subjective:   Patient ID: John Mejia, male    DOB: 10/24/1950, 72 y.o.   MRN: [id number]  HPI: John Mejia is a 72 y.o. male presenting on 09/04/2022 for Medical Management of Chronic Issues (CPE), Hypertension, and Hyperlipidemia   HPI Physical exam and check of chronic medical issues Patient denies any chest pain, shortness of breath, headaches or vision issues, abdominal complaints, diarrhea, nausea, vomiting, or joint issues.   Hypertension Patient is currently on amlodipine, and their blood pressure today is 136/6. Patient denies any lightheadedness or dizziness. Patient denies headaches, blurred vision, chest pains, shortness of breath, or weakness. Denies any side effects from medication and is content with current medication.   Hyperlipidemia Patient is coming in for recheck of his hyperlipidemia. The patient is currently taking atorvastatin. They deny any issues with myalgias or history of liver damage from it. They deny any focal numbness or weakness or chest pain.   Patient had elevated blood sugar on the last check.  Will check an A1c today.  Relevant past medical, surgical, family and social history reviewed and updated as indicated. Interim medical history since our last visit reviewed. Allergies and medications reviewed and updated.  Review of Systems  Constitutional:  Negative for chills and fever.  Eyes:  Negative for visual disturbance.  Respiratory:  Negative for shortness of breath and wheezing.   Cardiovascular:  Negative for chest pain and leg swelling.  Skin:  Negative for rash.  Psychiatric/Behavioral:  Negative for dysphoric mood, self-injury, sleep disturbance and suicidal ideas. The patient is not nervous/anxious.   All other systems reviewed and are negative.   Per HPI unless specifically indicated above   Allergies as of 09/04/2022   No  Known Allergies      Medication List        Accurate as of September 04, 2022  9:57 AM. If you have any questions, ask your nurse or doctor.          amLODipine 10 MG tablet Commonly known as: NORVASC Take 1 tablet (10 mg total) by mouth daily. What changed: additional instructions Changed by: Elige Radon Abriel Hattery, MD   aspirin EC 81 MG tablet Take 81 mg by mouth daily. Swallow whole.   atorvastatin 10 MG tablet Commonly known as: LIPITOR Take 1 tablet (10 mg total) by mouth daily. What changed: additional instructions Changed by: Elige Radon Suhey Radford, MD   co-enzyme Q-10 30 MG capsule Take 30 mg by mouth daily.   PROSTATE HEALTH PO Take by mouth.   sildenafil 20 MG tablet Commonly known as: REVATIO TAKE 2 TO 5 TABLETS BY MOUTH AS NEEDED FOR SEXUAL ACTIVITY         Objective:   BP 136/86   Pulse (!) 58   Ht 5\' 8"  (1.727 m)   Wt 228 lb (103.4 kg)   SpO2 98%   BMI 34.67 kg/m   Wt Readings from Last 3 Encounters:  09/04/22 228 lb (103.4 kg)  08/01/21 229 lb (103.9 kg)  07/03/21 225 lb (102.1 kg)    Physical Exam Vitals and nursing note reviewed.  Constitutional:      General: He is not in acute distress.    Appearance: He is well-developed. He is not diaphoretic.  HENT:     Right Ear: External ear  normal.     Left Ear: External ear normal.     Nose: Nose normal.     Mouth/Throat:     Pharynx: No oropharyngeal exudate.  Eyes:     General: No scleral icterus.       Right eye: No discharge.     Conjunctiva/sclera: Conjunctivae normal.     Pupils: Pupils are equal, round, and reactive to light.  Neck:     Thyroid: No thyromegaly.  Cardiovascular:     Rate and Rhythm: Normal rate and regular rhythm.     Heart sounds: Normal heart sounds. No murmur heard. Pulmonary:     Effort: Pulmonary effort is normal. No respiratory distress.     Breath sounds: Normal breath sounds. No wheezing.  Abdominal:     General: Bowel sounds are normal. There is no  distension.     Palpations: Abdomen is soft.     Tenderness: There is no abdominal tenderness. There is no guarding or rebound.  Genitourinary:    Prostate: Enlarged. Not tender and no nodules present.     Rectum: External hemorrhoid present. No tenderness.  Musculoskeletal:        General: Normal range of motion.     Cervical back: Neck supple.  Lymphadenopathy:     Cervical: No cervical adenopathy.  Skin:    General: Skin is warm and dry.     Findings: No rash.  Neurological:     Mental Status: He is alert and oriented to person, place, and time.     Coordination: Coordination normal.  Psychiatric:        Behavior: Behavior normal.       Assessment & Plan:   Problem List Items Addressed This Visit       Cardiovascular and Mediastinum   Hypertension   Relevant Medications   amLODipine (NORVASC) 10 MG tablet   atorvastatin (LIPITOR) 10 MG tablet   sildenafil (REVATIO) 20 MG tablet     Other   Hyperlipidemia   Relevant Medications   amLODipine (NORVASC) 10 MG tablet   atorvastatin (LIPITOR) 10 MG tablet   sildenafil (REVATIO) 20 MG tablet   Other Relevant Orders   CBC with Differential/Platelet   CMP14+EGFR   Lipid panel   Erectile dysfunction   Relevant Medications   sildenafil (REVATIO) 20 MG tablet   Other Relevant Orders   PSA, total and free   Other Visit Diagnoses     Physical exam    -  Primary   Relevant Orders   CBC with Differential/Platelet   CMP14+EGFR   Lipid panel   PSA, total and free   Essential hypertension       Relevant Medications   amLODipine (NORVASC) 10 MG tablet   atorvastatin (LIPITOR) 10 MG tablet   sildenafil (REVATIO) 20 MG tablet   Other Relevant Orders   CBC with Differential/Platelet   CMP14+EGFR   Lipid panel   Elevated blood sugar       Relevant Orders   CBC with Differential/Platelet   Bayer DCA Hb A1c Waived   Benign prostatic hyperplasia with urinary frequency       Relevant Orders   CBC with  Differential/Platelet   PSA, total and free       He has some urinary frequency but says it is not a significant amount.  Will check prostate levels today.  Blood pressure everything else looks good, will check blood work. Follow up plan: Return in about 1 year (around 09/04/2023), or if symptoms  worsen or fail to improve.  Counseling provided for all of the vaccine components Orders Placed This Encounter  Procedures   CBC with Differential/Platelet   CMP14+EGFR   Lipid panel   Bayer DCA Hb A1c Waived   PSA, total and free    Arville Care, MD [facility] 09/04/2022, 9:57 AM

## 2022-09-05 ENCOUNTER — Other Ambulatory Visit (HOSPITAL_COMMUNITY): Payer: Self-pay

## 2022-09-05 ENCOUNTER — Telehealth: Payer: Self-pay

## 2022-09-05 LAB — LIPID PANEL
Chol/HDL Ratio: 2.3 ratio (ref 0.0–5.0)
Cholesterol, Total: 116 mg/dL (ref 100–199)
HDL: 50 mg/dL (ref >39)
LDL Chol Calc (NIH): 53 mg/dL (ref 0–99)
Triglycerides: 55 mg/dL (ref 0–149)
VLDL Cholesterol Cal: 13 mg/dL (ref 5–40)

## 2022-09-05 LAB — CMP14+EGFR
ALT: 23 IU/L (ref 0–44)
AST: 18 IU/L (ref 0–40)
Albumin/Globulin Ratio: 2
Albumin: 4.7 g/dL (ref 3.8–4.8)
Alkaline Phosphatase: 115 IU/L (ref 44–121)
BUN/Creatinine Ratio: 8 — ABNORMAL LOW (ref 10–24)
BUN: 8 mg/dL (ref 8–27)
Bilirubin Total: 0.5 mg/dL (ref 0.0–1.2)
CO2: 22 mmol/L (ref 20–29)
Calcium: 11.4 mg/dL — ABNORMAL HIGH (ref 8.6–10.2)
Chloride: 108 mmol/L — ABNORMAL HIGH (ref 96–106)
Creatinine, Ser: 1.02 mg/dL (ref 0.76–1.27)
Globulin, Total: 2.3 g/dL (ref 1.5–4.5)
Glucose: 115 mg/dL — ABNORMAL HIGH (ref 70–99)
Potassium: 4.9 mmol/L (ref 3.5–5.2)
Sodium: 142 mmol/L (ref 134–144)
Total Protein: 7 g/dL (ref 6.0–8.5)
eGFR: 79 mL/min/{1.73_m2} (ref >59)

## 2022-09-05 LAB — PSA, TOTAL AND FREE
PSA, Free Pct: 30 %
PSA, Free: 0.33 ng/mL
Prostate Specific Ag, Serum: 1.1 ng/mL (ref 0.0–4.0)

## 2022-09-05 LAB — CBC WITH DIFFERENTIAL/PLATELET
Basophils Absolute: 0 10*3/uL (ref 0.0–0.2)
Basos: 1 %
EOS (ABSOLUTE): 0.1 10*3/uL (ref 0.0–0.4)
Eos: 3 %
Hematocrit: 43.8 % (ref 37.5–51.0)
Hemoglobin: 14.9 g/dL (ref 13.0–17.7)
Immature Grans (Abs): 0 10*3/uL (ref 0.0–0.1)
Immature Granulocytes: 0 %
Lymphocytes Absolute: 1.1 10*3/uL (ref 0.7–3.1)
Lymphs: 31 %
MCH: 26.8 pg (ref 26.6–33.0)
MCHC: 34 g/dL (ref 31.5–35.7)
MCV: 79 fL (ref 79–97)
Monocytes Absolute: 0.4 10*3/uL (ref 0.1–0.9)
Monocytes: 10 %
Neutrophils Absolute: 2 10*3/uL (ref 1.4–7.0)
Neutrophils: 55 %
Platelets: 245 10*3/uL (ref 150–450)
RBC: 5.56 x10E6/uL (ref 4.14–5.80)
RDW: 13.1 % (ref 11.6–15.4)
WBC: 3.7 10*3/uL (ref 3.4–10.8)

## 2022-09-05 NOTE — Telephone Encounter (Signed)
Patient Advocate Encounter   Received notification from [employer] Medicare Part D that prior authorization is required for Sildenafil Citrate 20MG  tablets   Submitted: 09-05-2022 Key [id number]  Status is pending

## 2022-09-07 NOTE — Telephone Encounter (Signed)
Patient Advocate Encounter  Received a fax from [employer] Medicare Part D regarding Prior Authorization for Sildenafil Citrate 20MG  tablets.   Key: [id number]  Authorization has been DENIED due to

## 2022-09-09 ENCOUNTER — Other Ambulatory Visit: Payer: Self-pay

## 2022-09-09 DIAGNOSIS — N529 Male erectile dysfunction, unspecified: Secondary | ICD-10-CM

## 2022-09-09 DIAGNOSIS — I10 Essential (primary) hypertension: Secondary | ICD-10-CM

## 2022-09-09 DIAGNOSIS — E782 Mixed hyperlipidemia: Secondary | ICD-10-CM

## 2022-09-09 MED ORDER — AMLODIPINE BESYLATE 10 MG PO TABS
10.0000 mg | ORAL_TABLET | Freq: Every day | ORAL | 3 refills | Status: AC
Start: 2022-09-09 — End: ?

## 2022-09-09 MED ORDER — ATORVASTATIN CALCIUM 10 MG PO TABS
10.0000 mg | ORAL_TABLET | Freq: Every day | ORAL | 3 refills | Status: AC
Start: 2022-09-09 — End: ?

## 2022-09-09 MED ORDER — SILDENAFIL CITRATE 20 MG PO TABS
ORAL_TABLET | ORAL | 0 refills | Status: DC
Start: 2022-09-09 — End: 2022-09-16

## 2022-09-09 NOTE — Telephone Encounter (Signed)
Yes I believe patient already knows this but they are not covered and he buys them by himself, please just let him know, if he would like [place] to send it to a different pharmacy then we can do that

## 2022-09-09 NOTE — Telephone Encounter (Signed)
Pts pays OOP for this medication.

## 2022-09-16 ENCOUNTER — Telehealth: Payer: Self-pay | Admitting: Family Medicine

## 2022-09-16 DIAGNOSIS — N529 Male erectile dysfunction, unspecified: Secondary | ICD-10-CM

## 2022-09-16 MED ORDER — SILDENAFIL CITRATE 20 MG PO TABS
ORAL_TABLET | ORAL | 1 refills | Status: AC
Start: 2022-09-16 — End: ?

## 2022-09-16 MED ORDER — SILDENAFIL CITRATE 20 MG PO TABS: ORAL_TABLET | ORAL | 1 refills | Status: DC

## 2022-09-16 NOTE — Telephone Encounter (Signed)
I sent a refill of the sildenafil for the patient.  Remind him that it is usually not covered by insurance

## 2022-09-16 NOTE — Telephone Encounter (Signed)
Dr Dettinger sent med to [facility] and pt needs it to go to [facility] in [place].  Resent to [facility] in [place]

## 2022-09-16 NOTE — Telephone Encounter (Signed)
Patient wants to know why there were no refills call in for his sildenafil (REVATIO) 20 MG tablet even though his was told he did not need to come back until next year.

## 2022-11-08 ENCOUNTER — Encounter: Payer: Self-pay | Admitting: Nurse Practitioner

## 2022-11-08 ENCOUNTER — Telehealth (INDEPENDENT_AMBULATORY_CARE_PROVIDER_SITE_OTHER): Payer: Medicare Other | Admitting: Nurse Practitioner

## 2022-11-08 DIAGNOSIS — R3915 Urgency of urination: Secondary | ICD-10-CM | POA: Diagnosis not present

## 2022-11-08 LAB — URINALYSIS, COMPLETE
Bilirubin, UA: NEGATIVE
Glucose, UA: NEGATIVE
Leukocytes,UA: NEGATIVE
Nitrite, UA: NEGATIVE
Specific Gravity, UA: 1.02 (ref 1.005–1.030)
Urobilinogen, Ur: 4 mg/dL — ABNORMAL HIGH (ref 0.2–1.0)
pH, UA: 6.5 (ref 5.0–7.5)

## 2022-11-08 LAB — MICROSCOPIC EXAMINATION
Bacteria, UA: NONE SEEN
Renal Epithel, UA: NONE SEEN /hpf
WBC, UA: NONE SEEN /hpf (ref 0–5)
Yeast, UA: NONE SEEN

## 2022-11-08 NOTE — Progress Notes (Signed)
Virtual Visit Consent   [facility], you are scheduled for a virtual visit with Mary-Margaret Daphine Deutscher, FNP, a [facility] provider, today.     Just as with appointments in the office, your consent must be obtained to participate.  Your consent will be active for this visit and any virtual visit you may have with one of our providers in the next 365 days.     If you have a MyChart account, a copy of this consent can be sent to you electronically.  All virtual visits are billed to your insurance company just like a traditional visit in the office.    As this is a virtual visit, video technology does not allow for your provider to perform a traditional examination.  This may limit your provider's ability to fully assess your condition.  If your provider identifies any concerns that need to be evaluated in person or the need to arrange testing (such as labs, EKG, etc.), we will make arrangements to do so.     Although advances in technology are sophisticated, we cannot ensure that it will always work on either your end or our end.  If the connection with a video visit is poor, the visit may have to be switched to a telephone visit.  With either a video or telephone visit, we are not always able to ensure that we have a secure connection.     I need to obtain your verbal consent now.   Are you willing to proceed with your visit today? YES   [facility] has provided verbal consent on 11/08/2022 for a virtual visit (video or telephone).   Mary-Margaret Daphine Deutscher, FNP   Date: 11/08/2022 1:21 PM   Virtual Visit via Video Note   I, Mary-Margaret Daphine Deutscher, connected with [facility] ([id number], Jul 16, 1950) on 11/08/22 at  5:15 PM EDT by a video-enabled telemedicine application and verified that I am speaking with the correct person using two identifiers.  Location: Patient: Virtual Visit Location Patient: Home Provider: Virtual Visit Location Provider: Mobile   I discussed the limitations of  evaluation and management by telemedicine and the availability of in person appointments. The patient expressed understanding and agreed to proceed.    History of Present Illness: John Mejia is a 72 y.o. who identifies as a male who was assigned male at birth, and is being seen today for uti.  HPI: Urinary Tract Infection  This is a new problem. The current episode started in the past 7 days. The problem occurs every urination. The problem has been gradually worsening. The quality of the pain is described as burning. The pain is at a severity of 2/10. The maximum temperature recorded prior to his arrival was 102 - 102.9 F. He is Not sexually active. There is No history of pyelonephritis. Associated symptoms include frequency. Pertinent negatives include no flank pain, hesitancy or urgency. He has tried nothing for the symptoms. The treatment provided mild relief.    Review of Systems  Constitutional:  Positive for fever (101-102). Negative for diaphoresis and weight loss.  Eyes:  Negative for blurred vision, double vision and pain.  Respiratory:  Negative for shortness of breath.   Cardiovascular:  Negative for chest pain, palpitations, orthopnea and leg swelling.  Gastrointestinal:  Negative for abdominal pain.  Genitourinary:  Positive for frequency. Negative for flank pain, hesitancy and urgency.  Skin:  Negative for rash.  Neurological:  Negative for dizziness, sensory change, loss of consciousness, weakness and headaches.  Endo/Heme/Allergies:  Negative for polydipsia. Does not bruise/bleed easily.  Psychiatric/Behavioral:  Negative for memory loss. The patient does not have insomnia.   All other systems reviewed and are negative.   Problems:  Patient Active Problem List   Diagnosis Date Noted   Erectile dysfunction 11/07/2014   Hypertension    Hyperlipidemia     Allergies: No Known Allergies Medications:  Current Outpatient Medications:    amLODipine (NORVASC) 10 MG tablet,  Take 1 tablet (10 mg total) by mouth daily., Disp: 100 tablet, Rfl: 3   aspirin 81 MG EC tablet, Take 81 mg by mouth daily. Swallow whole., Disp: , Rfl:    atorvastatin (LIPITOR) 10 MG tablet, Take 1 tablet (10 mg total) by mouth daily., Disp: 100 tablet, Rfl: 3   co-enzyme Q-10 30 MG capsule, Take 30 mg by mouth daily., Disp: , Rfl:    Misc Natural Products (PROSTATE HEALTH PO), Take by mouth., Disp: , Rfl:    sildenafil (REVATIO) 20 MG tablet, TAKE 2 TO 5 TABLETS BY MOUTH AS NEEDED FOR SEXUAL ACTIVITY, Disp: 90 tablet, Rfl: 1  Observations/Objective: Patient is well-developed, well-nourished in no acute distress.  Resting comfortably  at home.  Head is normocephalic, atraumatic.  No labored breathing.  Speech is clear and coherent with logical content.  Patient is alert and oriented at baseline.  No suprapubic pain  Assessment and Plan:  [facility] in today with chief complaint of Urinary Tract Infection   1. Urgency of urination Urine is clear Force fluids  - Urinalysis, Complete  2. Fever May have flu- to late to treat-has been greater then 48 hours since onset TO ED over the weekend if worsens Force fluids Rest Motrin OTC for fever  Attempted to contact patient about urine results- no answer on any number listed- message left on both lines listed iin chart.    Follow Up Instructions: I discussed the assessment and treatment plan with the patient. The patient was provided an opportunity to ask questions and all were answered. The patient agreed with the plan and demonstrated an understanding of the instructions.  A copy of instructions were sent to the patient via MyChart.  The patient was advised to call back or seek an in-person evaluation if the symptoms worsen or if the condition fails to improve as anticipated.  Time:  I spent 15 minutes with the patient via telehealth technology discussing the above problems/concerns.    Mary-Margaret Daphine Deutscher, FNP

## 2022-12-06 DIAGNOSIS — N200 Calculus of kidney: Secondary | ICD-10-CM | POA: Diagnosis not present

## 2022-12-06 DIAGNOSIS — E785 Hyperlipidemia, unspecified: Secondary | ICD-10-CM | POA: Diagnosis not present

## 2022-12-18 DIAGNOSIS — I10 Essential (primary) hypertension: Secondary | ICD-10-CM | POA: Diagnosis not present

## 2022-12-18 DIAGNOSIS — Z1159 Encounter for screening for other viral diseases: Secondary | ICD-10-CM | POA: Diagnosis not present

## 2022-12-18 DIAGNOSIS — E785 Hyperlipidemia, unspecified: Secondary | ICD-10-CM | POA: Diagnosis not present

## 2022-12-18 DIAGNOSIS — Z79899 Other long term (current) drug therapy: Secondary | ICD-10-CM | POA: Diagnosis not present

## 2022-12-18 DIAGNOSIS — Z0001 Encounter for general adult medical examination with abnormal findings: Secondary | ICD-10-CM | POA: Diagnosis not present

## 2023-09-08 ENCOUNTER — Encounter: Payer: Medicare Other | Admitting: Family Medicine
